# Patient Record
Sex: Male | Born: 1985 | Race: White | Hispanic: No | Marital: Single | Smoking: Current every day smoker
Health system: Southern US, Community
[De-identification: ages and names within clinical notes are randomized; demographics above are authoritative.]

## PROBLEM LIST (undated history)

## (undated) DIAGNOSIS — B958 Unspecified staphylococcus as the cause of diseases classified elsewhere: Secondary | ICD-10-CM

---
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
## 2008-04-26 ENCOUNTER — Emergency Department (HOSPITAL_COMMUNITY): Admission: EM | Admit: 2008-04-26 | Discharge: 2008-04-26 | Payer: Self-pay | Admitting: Emergency Medicine

## 2010-08-06 ENCOUNTER — Emergency Department (HOSPITAL_COMMUNITY): Payer: Self-pay

## 2010-08-06 ENCOUNTER — Emergency Department (HOSPITAL_COMMUNITY)
Admission: EM | Admit: 2010-08-06 | Discharge: 2010-08-06 | Discharge status: Against Medical Advice | Payer: Self-pay | Attending: Emergency Medicine | Admitting: Emergency Medicine

## 2010-08-06 DIAGNOSIS — R220 Localized swelling, mass and lump, head: Secondary | ICD-10-CM | POA: Insufficient documentation

## 2010-08-06 DIAGNOSIS — IMO0002 Reserved for concepts with insufficient information to code with codable children: Secondary | ICD-10-CM | POA: Insufficient documentation

## 2010-08-06 DIAGNOSIS — H5789 Other specified disorders of eye and adnexa: Secondary | ICD-10-CM | POA: Insufficient documentation

## 2010-08-06 DIAGNOSIS — S0280XA Fracture of other specified skull and facial bones, unspecified side, initial encounter for closed fracture: Secondary | ICD-10-CM | POA: Insufficient documentation

## 2010-08-06 DIAGNOSIS — Y929 Unspecified place or not applicable: Secondary | ICD-10-CM | POA: Insufficient documentation

## 2010-08-06 DIAGNOSIS — R221 Localized swelling, mass and lump, neck: Secondary | ICD-10-CM | POA: Insufficient documentation

## 2010-08-06 DIAGNOSIS — S022XXA Fracture of nasal bones, initial encounter for closed fracture: Secondary | ICD-10-CM | POA: Insufficient documentation

## 2010-08-06 DIAGNOSIS — R51 Headache: Secondary | ICD-10-CM | POA: Insufficient documentation

## 2010-08-06 LAB — COMPREHENSIVE METABOLIC PANEL
AST: 82 U/L — ABNORMAL HIGH (ref 0–37)
Albumin: 4.3 g/dL (ref 3.5–5.2)
Calcium: 8.3 mg/dL — ABNORMAL LOW (ref 8.4–10.5)
Creatinine, Ser: 0.87 mg/dL (ref 0.4–1.5)
GFR calc Af Amer: >60 mL/min (ref >60)
Total Protein: 7.3 g/dL (ref 6.0–8.3)

## 2010-08-06 LAB — CBC
HCT: 47 % (ref 39.0–52.0)
MCHC: 37.2 g/dL — ABNORMAL HIGH (ref 30.0–36.0)
MCV: 86.6 fL (ref 78.0–100.0)
RDW: 12.7 % (ref 11.5–15.5)
WBC: 14.8 10*3/uL — ABNORMAL HIGH (ref 4.0–10.5)

## 2010-08-06 LAB — DIFFERENTIAL
Eosinophils Relative: 0 % (ref 0–5)
Lymphocytes Relative: 10 % — ABNORMAL LOW (ref 12–46)
Lymphs Abs: 1.5 10*3/uL (ref 0.7–4.0)
Monocytes Absolute: 1 10*3/uL (ref 0.1–1.0)

## 2010-08-06 LAB — ETHANOL: Alcohol, Ethyl (B): 286 mg/dL — ABNORMAL HIGH (ref 0–10)

## 2010-08-06 MED ORDER — IOHEXOL 300 MG/ML  SOLN: 100.0000 mL | Freq: Once | INTRAMUSCULAR | Status: DC | PRN

## 2010-08-07 ENCOUNTER — Emergency Department (HOSPITAL_COMMUNITY): Payer: No Typology Code available for payment source

## 2010-08-07 ENCOUNTER — Emergency Department (HOSPITAL_COMMUNITY)
Admission: EM | Admit: 2010-08-07 | Discharge: 2010-08-07 | Disposition: A | Discharge status: Home / Self Care | Payer: No Typology Code available for payment source | Attending: Emergency Medicine | Admitting: Emergency Medicine

## 2010-08-07 DIAGNOSIS — R51 Headache: Secondary | ICD-10-CM | POA: Insufficient documentation

## 2010-08-07 DIAGNOSIS — S022XXA Fracture of nasal bones, initial encounter for closed fracture: Secondary | ICD-10-CM | POA: Insufficient documentation

## 2010-08-07 DIAGNOSIS — S0280XA Fracture of other specified skull and facial bones, unspecified side, initial encounter for closed fracture: Secondary | ICD-10-CM | POA: Insufficient documentation

## 2010-08-07 DIAGNOSIS — M542 Cervicalgia: Secondary | ICD-10-CM | POA: Insufficient documentation

## 2010-08-07 DIAGNOSIS — R079 Chest pain, unspecified: Secondary | ICD-10-CM | POA: Insufficient documentation

## 2010-08-07 DIAGNOSIS — H5710 Ocular pain, unspecified eye: Secondary | ICD-10-CM | POA: Insufficient documentation

## 2010-08-07 MED ORDER — IOHEXOL 300 MG/ML  SOLN
80.0000 mL | Freq: Once | INTRAMUSCULAR | Status: AC | PRN
  Administered 2010-08-07: 80 mL via INTRAVENOUS

## 2010-08-12 ENCOUNTER — Inpatient Hospital Stay (INDEPENDENT_AMBULATORY_CARE_PROVIDER_SITE_OTHER)
Admission: RE | Admit: 2010-08-12 | Discharge: 2010-08-12 | Disposition: A | Discharge status: Home / Self Care | Payer: No Typology Code available for payment source | Source: Ambulatory Visit | Attending: Emergency Medicine | Admitting: Emergency Medicine

## 2010-08-12 DIAGNOSIS — S0280XA Fracture of other specified skull and facial bones, unspecified side, initial encounter for closed fracture: Secondary | ICD-10-CM
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
## 2011-10-23 ENCOUNTER — Encounter (HOSPITAL_COMMUNITY): Payer: Self-pay | Admitting: *Deleted

## 2011-10-23 ENCOUNTER — Emergency Department (HOSPITAL_COMMUNITY)
Admission: EM | Admit: 2011-10-23 | Discharge: 2011-10-23 | Disposition: A | Discharge status: Home / Self Care | Payer: Self-pay | Attending: Emergency Medicine | Admitting: Emergency Medicine

## 2011-10-23 DIAGNOSIS — L02419 Cutaneous abscess of limb, unspecified: Secondary | ICD-10-CM | POA: Insufficient documentation

## 2011-10-23 DIAGNOSIS — L0291 Cutaneous abscess, unspecified: Secondary | ICD-10-CM

## 2011-10-23 DIAGNOSIS — F17200 Nicotine dependence, unspecified, uncomplicated: Secondary | ICD-10-CM | POA: Insufficient documentation

## 2011-10-23 DIAGNOSIS — L03319 Cellulitis of trunk, unspecified: Secondary | ICD-10-CM | POA: Insufficient documentation

## 2011-10-23 DIAGNOSIS — L02219 Cutaneous abscess of trunk, unspecified: Secondary | ICD-10-CM | POA: Insufficient documentation

## 2011-10-23 MED ORDER — CLINDAMYCIN PHOSPHATE 600 MG/50ML IV SOLN
600.0000 mg | Freq: Once | INTRAVENOUS | Status: AC
  Administered 2011-10-23: 600 mg via INTRAVENOUS
  Filled 2011-10-23: qty 50

## 2011-10-23 MED ORDER — OXYCODONE-ACETAMINOPHEN 5-325 MG PO TABS
2.0000 | ORAL_TABLET | Freq: Once | ORAL | Status: AC
  Administered 2011-10-23: 2 via ORAL
  Filled 2011-10-23: qty 2

## 2011-10-23 MED ORDER — CEPHALEXIN 500 MG PO CAPS: 500.0000 mg | ORAL_CAPSULE | Freq: Four times a day (QID) | ORAL | Status: DC

## 2011-10-23 MED ORDER — SULFAMETHOXAZOLE-TRIMETHOPRIM 800-160 MG PO TABS: 1.0000 | ORAL_TABLET | Freq: Two times a day (BID) | ORAL | Status: DC

## 2011-10-23 MED ORDER — OXYCODONE-ACETAMINOPHEN 5-325 MG PO TABS: 1.0000 | ORAL_TABLET | Freq: Four times a day (QID) | ORAL | Status: DC | PRN

## 2011-10-23 NOTE — ED Notes (Signed)
Pt states "I have boils from my knees and up, have been having them for 8 yrs, but my mom told me it could be staph and that's serious"

## 2011-10-23 NOTE — ED Provider Notes (Signed)
History     CSN: [id number]  Arrival date & time 10/23/11  1634   First MD Initiated Contact with Patient 10/23/11 1734      Chief Complaint  Patient presents with  . Abscess    (Consider location/radiation/quality/duration/timing/severity/associated sxs/prior treatment) HPI Comments: Patient presenting with an abscess to his abdomen, left anterior thigh, and upper right posterior leg.   He reports that these abscesses have been recurrent over the past 8 years.  He reports that the abscess on his left anterior thigh has been present approximately one week.  Area is gradually becoming more tender and more erythematous.  The abscess on his abdomen has been present for the past 4-5 days.  This area is also becoming more tender and erythematous.  Abscess on his posterior right upper leg has also been present for the past 4-5 days.  No known history of MRSA.  He is not diabetic.  He has not had any fever or chills.  No nausea or vomiting.  He has been putting Hydrogen Peroxide on the areas, which is not helping.    Patient is a 26 y.o. male presenting with abscess. The history is provided by the patient.  Abscess  This is a chronic problem. The problem has been gradually worsening. The abscess is characterized by redness and swelling. Pertinent negatives include no fever and no vomiting. He has received no recent medical care.    History reviewed. No pertinent past medical history.  History reviewed. No pertinent past surgical history.  No family history on file.  History  Substance Use Topics  . Smoking status: Current Everyday Smoker -- 0.5 packs/day  . Smokeless tobacco: Not on file  . Alcohol Use: 8.4 oz/week    14 Cans of beer per week      Review of Systems  Constitutional: Negative for fever and chills.  Respiratory: Negative for shortness of breath.   Gastrointestinal: Negative for nausea and vomiting.  Musculoskeletal: Negative for gait problem.  Skin: Positive for  color change.  All other systems reviewed and are negative.    Allergies  Review of patient's allergies indicates no known allergies.  Home Medications   Current Outpatient Rx  Name Route Sig Dispense Refill  . IBUPROFEN 200 MG PO TABS Oral Take 800 mg by mouth every 8 (eight) hours as needed. For pain.      BP 117/83  Pulse 91  Temp 99.4 F (37.4 C) (Oral)  Resp 18  Wt 250 lb (113.399 kg)  SpO2 100%  Physical Exam  Nursing note and vitals reviewed. Constitutional: He appears well-developed and well-nourished. No distress.  HENT:  Head: Normocephalic and atraumatic.  Mouth/Throat: Oropharynx is clear and moist.  Neck: Normal range of motion. Neck supple.  Cardiovascular: Normal rate, regular rhythm and normal heart sounds.   Pulmonary/Chest: Effort normal and breath sounds normal.  Musculoskeletal: Normal range of motion.       Full ROM of all the joints.  No erythema or edema of joints.  Neurological: He is alert. No sensory deficit. Gait normal.  Skin: He is not diaphoretic.     Psychiatric: He has a normal mood and affect.    ED Course  Procedures (including critical care time)  Labs Reviewed - No data to display No results found.   No diagnosis found.  Patient given round of IV Clindamycin.  Patient requesting to be discharged home.  MDM  Patient presenting with several  different abscesses with surrounding cellulitis.  Patient with  history of the same.  Afebrile.  VSS.  Well appearing.  Patient given a course of IV antibiotics and discharged home with prescription of Bactrim and Keflex.  Patient instructed to follow up in two days to have the areas rechecked.  Return precautions discussed.        Pascal Lux [place], PA-C 10/25/11 1400
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
## 2011-10-27 ENCOUNTER — Emergency Department (HOSPITAL_COMMUNITY)
Admission: EM | Admit: 2011-10-27 | Discharge: 2011-10-27 | Disposition: A | Discharge status: Home / Self Care | Payer: Self-pay | Attending: Emergency Medicine | Admitting: Emergency Medicine

## 2011-10-27 ENCOUNTER — Encounter (HOSPITAL_COMMUNITY): Payer: Self-pay | Admitting: *Deleted

## 2011-10-27 DIAGNOSIS — IMO0002 Reserved for concepts with insufficient information to code with codable children: Secondary | ICD-10-CM

## 2011-10-27 DIAGNOSIS — L03319 Cellulitis of trunk, unspecified: Secondary | ICD-10-CM | POA: Insufficient documentation

## 2011-10-27 DIAGNOSIS — L02219 Cutaneous abscess of trunk, unspecified: Secondary | ICD-10-CM | POA: Insufficient documentation

## 2011-10-27 DIAGNOSIS — F17200 Nicotine dependence, unspecified, uncomplicated: Secondary | ICD-10-CM | POA: Insufficient documentation

## 2011-10-27 MED ORDER — HYDROCODONE-ACETAMINOPHEN 5-325 MG PO TABS: 1.0000 | ORAL_TABLET | Freq: Four times a day (QID) | ORAL | Status: AC | PRN

## 2011-10-27 NOTE — ED Notes (Signed)
Pt has abscesses noted to abd, requesting to have them checked. States "they told me on Monday they were too infected to cut into, so I'm here for a recheck."

## 2011-10-27 NOTE — ED Provider Notes (Signed)
Medical screening examination/treatment/procedure(s) were performed by non-physician practitioner and as supervising physician I was immediately available for consultation/collaboration.  Chrissi Crow L Alpha Mysliwiec, MD 10/27/11 0052 

## 2011-10-27 NOTE — ED Provider Notes (Signed)
History     CSN: [id number]  Arrival date & time 10/27/11  1613   First MD Initiated Contact with Patient 10/27/11 1643      Chief Complaint  Patient presents with  . Wound Check    (Consider location/radiation/quality/duration/timing/severity/associated sxs/prior treatment) HPI Comments: Patient presents emergency department with chief complaint of double and check.  Patient was evaluated on July 15 for multiple abscesses with surrounding cellulitis.  At that time he was treated with IV clindamycin and discharged with Bactrim DS and Keflex.  Patient states that he has been taking antibiotics as directed and that the surrounding erythema has decreased in size.  He also denies any fevers, night sweats, chills, nausea, vomiting, or abdominal pain.  Patient reports that the worst abscess located on the anterior left upper thigh spontaneous bleed began to drain yesterday.  Abscess located on abdomen has become more fluctuant.  Patient has no other complaints at this time.  Patient is a 26 y.o. male presenting with wound check. The history is provided by the patient.  Wound Check     History reviewed. No pertinent past medical history.  History reviewed. No pertinent past surgical history.  History reviewed. No pertinent family history.  History  Substance Use Topics  . Smoking status: Current Everyday Smoker -- 0.5 packs/day  . Smokeless tobacco: Not on file  . Alcohol Use: 8.4 oz/week    14 Cans of beer per week      Review of Systems  Constitutional: Negative for fever, chills, diaphoresis and activity change.       Denies night sweats  HENT: Negative for neck stiffness.   Eyes: Negative for visual disturbance.  Respiratory: Negative for shortness of breath.   Cardiovascular: Negative for chest pain.  Gastrointestinal: Negative for abdominal pain.  Genitourinary: Negative for dysuria, urgency and frequency.  Musculoskeletal: Negative for gait problem.  Skin: Positive for  color change and rash.  Neurological: Negative for dizziness, light-headedness and headaches.  Hematological: Negative for adenopathy.  All other systems reviewed and are negative.    Allergies  Review of patient's allergies indicates no known allergies.  Home Medications   Current Outpatient Rx  Name Route Sig Dispense Refill  . CEPHALEXIN 500 MG PO CAPS Oral Take 500 mg by mouth 4 (four) times daily.    . OXYCODONE-ACETAMINOPHEN 5-325 MG PO TABS Oral Take 1-2 tablets by mouth every 6 (six) hours as needed.    . SULFAMETHOXAZOLE-TRIMETHOPRIM 800-160 MG PO TABS Oral Take 1 tablet by mouth every 12 (twelve) hours.      BP 113/68  Pulse 68  Temp 97.8 F (36.6 C) (Oral)  Resp 14  SpO2 93%  Physical Exam  Nursing note and vitals reviewed. Constitutional: He is oriented to person, place, and time. He appears well-developed and well-nourished. He does not have a sickly appearance. He does not appear ill. No distress.  HENT:  Head: Normocephalic and atraumatic.  Eyes: Conjunctivae and EOM are normal.  Neck: Normal range of motion. Neck supple.  Cardiovascular: Normal rate and regular rhythm.   Pulmonary/Chest: Effort normal and breath sounds normal.  Musculoskeletal: He exhibits no edema.  Lymphadenopathy:       Head (right side): No submental, no preauricular and no posterior auricular adenopathy present.       Head (left side): No submental, no submandibular, no preauricular and no posterior auricular adenopathy present.    He has no axillary adenopathy.  Neurological: He is alert and oriented to person, place, and  time.  Skin: Skin is warm and dry. No rash noted. He is not diaphoretic.          Improved abscesses, see skin chart    ED Course  Procedures (including critical care time)  Labs Reviewed - No data to display No results found.   No diagnosis found. INCISION AND DRAINAGE Performed by: Jaci Carrel Consent: Verbal consent obtained. Risks and benefits:  risks, benefits and alternatives were discussed Type: abscess  Body area: abdomen  Anesthesia: local infiltration  Local anesthetic: lidocaine 2% w epinephrine  Anesthetic total: 2 ml  Complexity: complex Blunt dissection to break up loculations  Drainage: purulent  Drainage amount: large  Packing material: 1/4 in iodoform gauze  Patient tolerance: Patient tolerated the procedure well with no immediate complications.     MDM  Abscesses  Patient with multiple skin abscess, abdomen abscess amenable to incision and drainage.  Abscess was large enough to warrant packing with removal and wound recheck in 2 days. Improved signs of cellulitis.  Will d/c to home.  Advised to continue taking abx as previously prescribed            Jaci Carrel, PA-C 10/27/11 1726

## 2011-10-28 NOTE — ED Provider Notes (Signed)
Medical screening examination/treatment/procedure(s) were performed by non-physician practitioner and as supervising physician I was immediately available for consultation/collaboration.   Jhonny Calixto Y. Jaydin Jalomo, MD 10/28/11 [phone] 

## 2011-10-31 ENCOUNTER — Encounter (HOSPITAL_COMMUNITY): Payer: Self-pay | Admitting: Emergency Medicine

## 2011-10-31 ENCOUNTER — Emergency Department (HOSPITAL_COMMUNITY)
Admission: EM | Admit: 2011-10-31 | Discharge: 2011-10-31 | Disposition: A | Discharge status: Home / Self Care | Payer: No Typology Code available for payment source | Attending: Emergency Medicine | Admitting: Emergency Medicine

## 2011-10-31 DIAGNOSIS — Z5189 Encounter for other specified aftercare: Secondary | ICD-10-CM

## 2011-10-31 DIAGNOSIS — F17200 Nicotine dependence, unspecified, uncomplicated: Secondary | ICD-10-CM | POA: Insufficient documentation

## 2011-10-31 DIAGNOSIS — L02219 Cutaneous abscess of trunk, unspecified: Secondary | ICD-10-CM | POA: Insufficient documentation

## 2011-10-31 NOTE — ED Notes (Signed)
Pt here for wound recheck from I and D of abscess.

## 2011-10-31 NOTE — Discharge Instructions (Signed)
Continue to clean your wound with soap and water.  Apply warm compress over wound at least twice daily until wound is healed.  Return if you notice worsening pain, swelling or if you have other concerns.    Wound Care Wound care helps prevent pain and infection.  You may need a tetanus shot if:  You cannot remember when you had your last tetanus shot.   You have never had a tetanus shot.   The injury broke your skin.  If you need a tetanus shot and you choose not to have one, you may get tetanus. Sickness from tetanus can be serious. HOME CARE   Only take medicine as told by your doctor.   Clean the wound daily with mild soap and water.   Change any bandages (dressings) as told by your doctor.   Put medicated cream and a bandage on the wound as told by your doctor.   Change the bandage if it gets wet, dirty, or starts to smell.   Take showers. Do not take baths, swim, or do anything that puts your wound under water.   Rest and raise (elevate) the wound until the pain and puffiness (swelling) are better.   Keep all doctor visits as told.  GET HELP RIGHT AWAY IF:   Yellowish-white fluid (pus) comes from the wound.   Medicine does not lessen your pain.   There is a red streak going away from the wound.   You cannot move your finger or toe.   You have a fever.  MAKE SURE YOU:   Understand these instructions.   Will watch your condition.   Will get help right away if you are not doing well or get worse.  Document Released: 01/04/2008 Document Revised: 03/16/2011 Document Reviewed: 07/31/2010 [facility] Patient Information 2012 [place], [place].

## 2011-10-31 NOTE — ED Provider Notes (Signed)
History     CSN: [id number]  Arrival date & time 10/31/11  1349   First MD Initiated Contact with Patient 10/31/11 1355      No chief complaint on file.   (Consider location/radiation/quality/duration/timing/severity/associated sxs/prior treatment) HPI  Pt presents for wound recheck.  Has abdominal abscess with I&D procedure 4 days ago.  Is here for wound recheck. Sts wound is healing well, denies any pain, fever, or rash.    No past medical history on file.  No past surgical history on file.  No family history on file.  History  Substance Use Topics  . Smoking status: Current Everyday Smoker -- 0.5 packs/day  . Smokeless tobacco: Not on file  . Alcohol Use: 8.4 oz/week    14 Cans of beer per week      Review of Systems  Constitutional: Negative for fever.  Skin: Positive for wound. Negative for rash.  Neurological: Negative for numbness.  All other systems reviewed and are negative.    Allergies  Review of patient's allergies indicates no known allergies.  Home Medications   Current Outpatient Rx  Name Route Sig Dispense Refill  . CEPHALEXIN 500 MG PO CAPS Oral Take 500 mg by mouth 4 (four) times daily. Pt's on day 8 of therapy    . HYDROCODONE-ACETAMINOPHEN 5-325 MG PO TABS Oral Take 1 tablet by mouth every 6 (six) hours as needed for pain. 15 tablet 0  . SULFAMETHOXAZOLE-TRIMETHOPRIM 800-160 MG PO TABS Oral Take 1 tablet by mouth every 12 (twelve) hours. Pt's on day 8 of therapy    . OXYCODONE-ACETAMINOPHEN 5-325 MG PO TABS Oral Take 1-2 tablets by mouth every 6 (six) hours as needed.      There were no vitals taken for this visit.  Physical Exam  Nursing note and vitals reviewed. Constitutional: He appears well-developed and well-nourished. No distress.  HENT:  Head: Atraumatic.  Eyes: Conjunctivae are normal.  Abdominal: Soft.    Neurological: He is alert.  Skin: Skin is warm. No rash noted.    ED Course  Procedures (including critical care  time)  Labs Reviewed - No data to display No results found.   No diagnosis found.    MDM  abd abscess assessed by me.  Wound appear well healing.  Packing removed with serous sanguinous drainage, no pus.  Care instruction include soap, warm compress were discussed.  Pt voice understanding and agrees with plan.         Fayrene Helper, PA-C 10/31/11 1429

## 2011-11-03 NOTE — ED Provider Notes (Signed)
Medical screening examination/treatment/procedure(s) were performed by non-physician practitioner and as supervising physician I was immediately available for consultation/collaboration.  Donnetta Hutching, MD 11/03/11 [phone]

## 2011-11-27 ENCOUNTER — Encounter (HOSPITAL_COMMUNITY): Payer: Self-pay | Admitting: Emergency Medicine

## 2011-11-27 ENCOUNTER — Emergency Department (HOSPITAL_COMMUNITY)
Admission: EM | Admit: 2011-11-27 | Discharge: 2011-11-27 | Disposition: A | Discharge status: Home / Self Care | Payer: Self-pay | Attending: Emergency Medicine | Admitting: Emergency Medicine

## 2011-11-27 DIAGNOSIS — L0231 Cutaneous abscess of buttock: Secondary | ICD-10-CM | POA: Insufficient documentation

## 2011-11-27 DIAGNOSIS — F17200 Nicotine dependence, unspecified, uncomplicated: Secondary | ICD-10-CM | POA: Insufficient documentation

## 2011-11-27 DIAGNOSIS — L0291 Cutaneous abscess, unspecified: Secondary | ICD-10-CM

## 2011-11-27 HISTORY — DX: Unspecified staphylococcus as the cause of diseases classified elsewhere: B95.8

## 2011-11-27 MED ORDER — OXYCODONE-ACETAMINOPHEN 5-325 MG PO TABS
2.0000 | ORAL_TABLET | Freq: Once | ORAL | Status: AC
  Administered 2011-11-27: 2 via ORAL
  Filled 2011-11-27: qty 2

## 2011-11-27 MED ORDER — CEPHALEXIN 500 MG PO CAPS: 500.0000 mg | ORAL_CAPSULE | Freq: Four times a day (QID) | ORAL | Status: DC

## 2011-11-27 MED ORDER — OXYCODONE-ACETAMINOPHEN 5-325 MG PO TABS: 1.0000 | ORAL_TABLET | Freq: Four times a day (QID) | ORAL | Status: DC | PRN

## 2011-11-27 MED ORDER — SULFAMETHOXAZOLE-TRIMETHOPRIM 800-160 MG PO TABS: 1.0000 | ORAL_TABLET | Freq: Two times a day (BID) | ORAL | Status: DC

## 2011-11-27 NOTE — ED Provider Notes (Signed)
History     CSN: [id number]  Arrival date & time 11/27/11  1251   First MD Initiated Contact with Patient 11/27/11 1348      Chief Complaint  Patient presents with  . Abscess    swollen red area on r/buttock    (Consider location/radiation/quality/duration/timing/severity/associated sxs/prior treatment) HPI Comments: Frank Winters 26 y.o. male   The chief complaint is: Patient presents with:   Abscess - swollen red area on r/buttock   The patient has medical history significant for:   Past Medical History:   Staph infection                                             Patient with history of multiple abscesses present for painful area on lower R buttock. He states that it began 2 days ago and has affected his ability to sleep. Denies fever, chills, or night sweats. Denies NVD, or abdominal pain. Denies SOB.     Patient is a 26 y.o. male presenting with abscess. The history is provided by the patient.  Abscess  Pertinent negatives include no fever, no diarrhea and no vomiting.    Past Medical History  Diagnosis Date  . Staph infection     History reviewed. No pertinent past surgical history.  History reviewed. No pertinent family history.  History  Substance Use Topics  . Smoking status: Current Everyday Smoker -- 0.5 packs/day  . Smokeless tobacco: Not on file  . Alcohol Use: No      Review of Systems  Constitutional: Positive for activity change. Negative for fever, chills and diaphoresis.  Respiratory: Negative for shortness of breath.   Gastrointestinal: Negative for nausea, vomiting, abdominal pain and diarrhea.  All other systems reviewed and are negative.    Allergies  Review of patient's allergies indicates no known allergies.  Home Medications   Current Outpatient Rx  Name Route Sig Dispense Refill  . ACETAMINOPHEN 500 MG PO TABS Oral Take 1,000 mg by mouth every 6 (six) hours as needed. For pain.    . CEPHALEXIN 500 MG PO CAPS Oral  Take 1 capsule (500 mg total) by mouth 4 (four) times daily. 20 capsule 0  . OXYCODONE-ACETAMINOPHEN 5-325 MG PO TABS Oral Take 1 tablet by mouth every 6 (six) hours as needed for pain. 10 tablet 0  . SULFAMETHOXAZOLE-TRIMETHOPRIM 800-160 MG PO TABS Oral Take 1 tablet by mouth 2 (two) times daily. 14 tablet 0    BP 124/72  Pulse 68  Temp 97.8 F (36.6 C) (Oral)  Resp 16  SpO2 99%  Physical Exam  Nursing note and vitals reviewed. Constitutional: He appears well-developed and well-nourished. No distress.  HENT:  Head: Normocephalic and atraumatic.  Mouth/Throat: Oropharynx is clear and moist.  Eyes: Conjunctivae and EOM are normal. Pupils are equal, round, and reactive to light. No scleral icterus.  Neck: Normal range of motion. Neck supple.  Cardiovascular: Normal rate, regular rhythm and normal heart sounds.   Pulmonary/Chest: Effort normal and breath sounds normal.  Abdominal: Soft. There is no tenderness.  Neurological: He is alert.  Skin: Skin is warm and dry.    ED Course  Procedures (including critical care time)  Labs Reviewed - No data to display No results found. INCISION AND DRAINAGE Performed by: Pixie Casino Consent: Verbal consent obtained. Risks and benefits: risks, benefits and alternatives were discussed Type: abscess  Body area: R buttock  Anesthesia: local infiltration  Local anesthetic: lidocaine 1%   Anesthetic total: 2 ml  Complexity: complex Blunt dissection to break up loculations  Drainage: purulent  Drainage amount: 4ml  Packing material: 1/4 in iodoform gauze  Patient tolerance: Patient tolerated the procedure well with no immediate complications.     1. Abscess       MDM  Patient presented with painful, red,indurated area on R inferior buttock. I & D performed without complication. Patient given pain medication in ED. Patient discharged on ABX and pain medication. No red flags for cellulitis. Return precautions given verbally  and in discharge summary.        Pixie Casino, PA-C 11/27/11 1647

## 2011-11-27 NOTE — ED Notes (Signed)
Persistent red raised, painful area on r/buttock. Has hx of same

## 2011-11-30 NOTE — ED Provider Notes (Signed)
Medical screening examination/treatment/procedure(s) were performed by non-physician practitioner and as supervising physician I was immediately available for consultation/collaboration.   Rolan Bucco, MD 11/30/11 [phone]

## 2011-12-04 ENCOUNTER — Emergency Department (HOSPITAL_COMMUNITY)
Admission: EM | Admit: 2011-12-04 | Discharge: 2011-12-04 | Disposition: A | Discharge status: Home / Self Care | Payer: Self-pay | Attending: Emergency Medicine | Admitting: Emergency Medicine

## 2011-12-04 DIAGNOSIS — F17200 Nicotine dependence, unspecified, uncomplicated: Secondary | ICD-10-CM | POA: Insufficient documentation

## 2011-12-04 DIAGNOSIS — L509 Urticaria, unspecified: Secondary | ICD-10-CM | POA: Insufficient documentation

## 2011-12-04 DIAGNOSIS — Z872 Personal history of diseases of the skin and subcutaneous tissue: Secondary | ICD-10-CM

## 2011-12-04 DIAGNOSIS — F419 Anxiety disorder, unspecified: Secondary | ICD-10-CM

## 2011-12-04 DIAGNOSIS — F411 Generalized anxiety disorder: Secondary | ICD-10-CM | POA: Insufficient documentation

## 2011-12-04 MED ORDER — PREDNISONE 20 MG PO TABS: 40.0000 mg | ORAL_TABLET | Freq: Every day | ORAL | Status: AC

## 2011-12-04 MED ORDER — ALPRAZOLAM 1 MG PO TABS: 1.0000 mg | ORAL_TABLET | Freq: Every evening | ORAL | Status: AC | PRN

## 2011-12-04 MED ORDER — RANITIDINE HCL 150 MG PO TABS: 150.0000 mg | ORAL_TABLET | Freq: Two times a day (BID) | ORAL | Status: DC

## 2011-12-05 NOTE — ED Provider Notes (Signed)
History     CSN: [id number]  Arrival date & time 12/04/11  1640   None     Chief Complaint  Patient presents with  . Urticaria    (Consider location/radiation/quality/duration/timing/severity/associated sxs/prior treatment) HPI Comments: Patient presents with a 3 day history of hives outbreak that starts in the morning. He reports the rash being all over his body and pruritic. He states taking 3 benadryl has relieved the rash for the past couple days but he is unsure why the outbreak keeps happening. He reports a childhood history of breaking out in hives with stress and he thinks this might be the same thing. He reports currently being under stress with work, finances, and relationship. He denies any trouble breathing or difficulty swallowing during the hives outbreaks. He denies any allergies or recently changing soaps or detergents. He denies recent illness, fever.   Patient is a 26 y.o. male presenting with urticaria.  Urticaria Associated symptoms include a rash. Pertinent negatives include no abdominal pain, arthralgias, chest pain, chills, diaphoresis, fatigue, fever, headaches, myalgias, nausea, numbness, vomiting or weakness.    Past Medical History  Diagnosis Date  . Staph infection     No past surgical history on file.  No family history on file.  History  Substance Use Topics  . Smoking status: Current Everyday Smoker -- 0.5 packs/day  . Smokeless tobacco: Not on file  . Alcohol Use: No      Review of Systems  Constitutional: Negative for fever, chills, diaphoresis and fatigue.  HENT: Negative for trouble swallowing.   Eyes: Negative for photophobia and visual disturbance.  Respiratory: Negative for choking, chest tightness, shortness of breath and wheezing.   Cardiovascular: Negative for chest pain.  Gastrointestinal: Negative for nausea, vomiting, abdominal pain and diarrhea.  Musculoskeletal: Negative for myalgias and arthralgias.  Skin: Positive for  rash.  Neurological: Negative for dizziness, syncope, weakness, numbness and headaches.    Allergies  Review of patient's allergies indicates no known allergies.  Home Medications   Current Outpatient Rx  Name Route Sig Dispense Refill  . ACETAMINOPHEN 500 MG PO TABS Oral Take 1,000 mg by mouth every 6 (six) hours as needed. For pain.    . CEPHALEXIN 500 MG PO CAPS Oral Take 500 mg by mouth 4 (four) times daily.    Marland Kitchen DIPHENHYDRAMINE HCL 25 MG PO TABS Oral Take 50 mg by mouth every 6 (six) hours as needed. Hives    . OXYCODONE-ACETAMINOPHEN 5-325 MG PO TABS Oral Take 1 tablet by mouth every 4 (four) hours as needed. Pain    . SULFAMETHOXAZOLE-TRIMETHOPRIM 800-160 MG PO TABS Oral Take 1 tablet by mouth 2 (two) times daily.    Marland Kitchen ALPRAZOLAM 1 MG PO TABS Oral Take 1 tablet (1 mg total) by mouth at bedtime as needed for sleep. 10 tablet 0  . PREDNISONE 20 MG PO TABS Oral Take 2 tablets (40 mg total) by mouth daily. 10 tablet 0  . RANITIDINE HCL 150 MG PO TABS Oral Take 1 tablet (150 mg total) by mouth 2 (two) times daily. 60 tablet 0    BP 148/97  Pulse 99  Temp 98.6 F (37 C)  Resp 16  SpO2 99%  Physical Exam  Nursing note and vitals reviewed. Constitutional: He is oriented to person, place, and time. He appears well-developed and well-nourished. No distress.  HENT:  Head: Normocephalic and atraumatic.  Mouth/Throat: No oropharyngeal exudate.       No evidence of urticarial lesions in mouth.  Eyes: Conjunctivae are normal. Pupils are equal, round, and reactive to light. No scleral icterus.  Neck: Normal range of motion. Neck supple.  Cardiovascular: Normal rate and regular rhythm.  Exam reveals no gallop and no friction rub.   No murmur heard. Pulmonary/Chest: Effort normal and breath sounds normal. No respiratory distress. He has no wheezes. He has no rales. He exhibits no tenderness.  Abdominal: Soft. There is no tenderness.  Musculoskeletal: Normal range of motion.    Neurological: He is alert and oriented to person, place, and time.  Skin: Skin is warm and dry. He is not diaphoretic.       Few remaining non-raised lesions on legs and arms bilaterally.   Psychiatric: His behavior is normal.       Patient has flat affect and reports feeling anxious.     ED Course  Procedures (including critical care time)  Labs Reviewed - No data to display No results found.   1. History of urticaria   2. Anxiety       MDM   Patient shows no current signs of rash or hives. He reports a childhood history of hives with anxiety and he reports being under a lot of stress currently with his job, relationship, and finances. The rash may have an emotional aspect to the outbreak due to his childhood history and current anxiety and unknown trigger. I will prescribe him prednisone and zantac for outbreaks as well as a few xanax for anxiety. He should follow up with a low-cost medical provider in the area for outpatient management of his anxiety and rash. Patient has no signs of respiratory compromise. No further evaluation needed at this time.         Emilia Beck, PA-C 12/06/11 [phone]

## 2011-12-08 NOTE — ED Provider Notes (Signed)
Medical screening examination/treatment/procedure(s) were performed by non-physician practitioner and as supervising physician I was immediately available for consultation/collaboration.  Akeylah Hendel T Yudith Norlander, MD 12/08/11 2350 

## 2012-08-18 ENCOUNTER — Emergency Department (HOSPITAL_COMMUNITY)
Admission: EM | Admit: 2012-08-18 | Discharge: 2012-08-19 | Disposition: A | Discharge status: Home / Self Care | Payer: Self-pay | Attending: Emergency Medicine | Admitting: Emergency Medicine

## 2012-08-18 ENCOUNTER — Encounter (HOSPITAL_COMMUNITY): Payer: Self-pay | Admitting: *Deleted

## 2012-08-18 DIAGNOSIS — F17200 Nicotine dependence, unspecified, uncomplicated: Secondary | ICD-10-CM | POA: Insufficient documentation

## 2012-08-18 DIAGNOSIS — L0231 Cutaneous abscess of buttock: Secondary | ICD-10-CM | POA: Insufficient documentation

## 2012-08-18 DIAGNOSIS — Z8619 Personal history of other infectious and parasitic diseases: Secondary | ICD-10-CM | POA: Insufficient documentation

## 2012-08-18 DIAGNOSIS — L0291 Cutaneous abscess, unspecified: Secondary | ICD-10-CM

## 2012-08-18 MED ORDER — SULFAMETHOXAZOLE-TRIMETHOPRIM 800-160 MG PO TABS: 1.0000 | ORAL_TABLET | Freq: Two times a day (BID) | ORAL | Status: DC

## 2012-08-18 MED ORDER — HYDROCODONE-ACETAMINOPHEN 5-325 MG PO TABS
2.0000 | ORAL_TABLET | Freq: Once | ORAL | Status: AC
  Administered 2012-08-18: 2 via ORAL
  Filled 2012-08-18: qty 2

## 2012-08-18 NOTE — ED Notes (Signed)
Pt presents w/ c/o multiple abscesses w/ hx of same. Pt has recurring abscess to R buttock.

## 2012-08-18 NOTE — ED Provider Notes (Signed)
History     CSN: [id number]  Arrival date & time 08/18/12  2156   First MD Initiated Contact with Patient 08/18/12 2222      Chief Complaint  Patient presents with  . Abscess    (Consider location/radiation/quality/duration/timing/severity/associated sxs/prior treatment) HPI Comments: 27 y/o male with a PMHx of multiple abscesses presents to the ED complaining of an abscess to his right buttock region that he noticed 5 days ago. Patient states area has been getting larger and more red over the past 5 days. No drainage. Also states there was an abscess on the right side of his suprapubic region presenting around the same time which drained itself. Denies fever, chills, nausea, vomiting.  Patient is a 27 y.o. male presenting with abscess. The history is provided by the patient.  Abscess Associated symptoms: no fever, no nausea and no vomiting     Past Medical History  Diagnosis Date  . Staph infection     History reviewed. No pertinent past surgical history.  History reviewed. No pertinent family history.  History  Substance Use Topics  . Smoking status: Current Every Day Smoker -- 0.50 packs/day    Types: Cigarettes  . Smokeless tobacco: Not on file  . Alcohol Use: 0.0 oz/week     Comment: 1-2 beers/ day      Review of Systems  Constitutional: Negative for fever and chills.  Gastrointestinal: Negative for nausea and vomiting.  Skin:       Positive for multiple abscesses.  All other systems reviewed and are negative.    Allergies  Review of patient's allergies indicates no known allergies.  Home Medications   Current Outpatient Rx  Name  Route  Sig  Dispense  Refill  . acetaminophen (TYLENOL) 500 MG tablet   Oral   Take 1,000 mg by mouth every 6 (six) hours as needed. For pain.         Marland Kitchen DM-Phenylephrine-Acetaminophen (VICKS DAYQUIL COLD & FLU) 10-5-325 MG CAPS   Oral   Take 1 capsule by mouth.         . loratadine (CLARITIN) 10 MG tablet   Oral  Take 10 mg by mouth daily.           BP 142/84  Pulse 94  Temp(Src) 97.4 F (36.3 C) (Oral)  Resp 16  Ht 6\' 11"  (2.108 m)  Wt 250 lb (113.399 kg)  BMI 25.52 kg/m2  SpO2 98%  Physical Exam  Nursing note and vitals reviewed. Constitutional: He is oriented to person, place, and time. He appears well-developed and well-nourished. No distress.  HENT:  Head: Normocephalic and atraumatic.  Mouth/Throat: Oropharynx is clear and moist.  Eyes: Conjunctivae are normal.  Neck: Normal range of motion. Neck supple.  Cardiovascular: Normal rate, regular rhythm and normal heart sounds.   Pulmonary/Chest: Effort normal and breath sounds normal.  Musculoskeletal: Normal range of motion. He exhibits no edema.  Lymphadenopathy:       Right: No inguinal adenopathy present.       Left: No inguinal adenopathy present.  Neurological: He is alert and oriented to person, place, and time.  Skin: Skin is warm and dry. He is not diaphoretic.     1 cm diameter area of erythema in right groin region. No abscess. 1 mm central opening. No active drainage. Tender to palpation.  Psychiatric: He has a normal mood and affect. His behavior is normal.    ED Course  Procedures (including critical care time) INCISION AND DRAINAGE Performed by:  Johnnette Gourd, Leo Rod PA student Consent: Verbal consent obtained. Risks and benefits: risks, benefits and alternatives were discussed Type: abscess  Body area: right buttock  Anesthesia: local infiltration  Incision was made with a scalpel.  Local anesthetic: lidocaine 2% with epinephrine  Anesthetic total: 5 ml  Complexity: complex Blunt dissection to break up loculations  Drainage: purulent  Drainage amount: small  Packing material: none  Patient tolerance: Patient tolerated the procedure well with no immediate complications.    Labs Reviewed - No data to display No results found.   1. Abscess and cellulitis       MDM  27 y/o  male with abscess and cellulitis. Abscess drained without any complication. Rx bactrim due to surrounding cellulitis. Return precautions discussed. Patient states understanding of plan and is agreeable.        Trevor Mace, PA-C 08/18/12 2328

## 2012-08-18 NOTE — ED Provider Notes (Signed)
Medical screening examination/treatment/procedure(s) were performed by non-physician practitioner and as supervising physician I was immediately available for consultation/collaboration.  Gilda Crease, MD 08/18/12 [phone]

## 2013-09-26 ENCOUNTER — Emergency Department (HOSPITAL_COMMUNITY)
Admission: EM | Admit: 2013-09-26 | Discharge: 2013-09-26 | Disposition: A | Discharge status: Home / Self Care | Payer: No Typology Code available for payment source | Attending: Emergency Medicine | Admitting: Emergency Medicine

## 2013-09-26 ENCOUNTER — Encounter (HOSPITAL_COMMUNITY): Payer: Self-pay | Admitting: Emergency Medicine

## 2013-09-26 DIAGNOSIS — Z792 Long term (current) use of antibiotics: Secondary | ICD-10-CM | POA: Insufficient documentation

## 2013-09-26 DIAGNOSIS — L0291 Cutaneous abscess, unspecified: Secondary | ICD-10-CM

## 2013-09-26 DIAGNOSIS — L0231 Cutaneous abscess of buttock: Secondary | ICD-10-CM | POA: Insufficient documentation

## 2013-09-26 DIAGNOSIS — Z79899 Other long term (current) drug therapy: Secondary | ICD-10-CM | POA: Insufficient documentation

## 2013-09-26 DIAGNOSIS — L03317 Cellulitis of buttock: Principal | ICD-10-CM

## 2013-09-26 DIAGNOSIS — F17200 Nicotine dependence, unspecified, uncomplicated: Secondary | ICD-10-CM | POA: Insufficient documentation

## 2013-09-26 DIAGNOSIS — Z8619 Personal history of other infectious and parasitic diseases: Secondary | ICD-10-CM | POA: Insufficient documentation

## 2013-09-26 MED ORDER — OXYCODONE-ACETAMINOPHEN 5-325 MG PO TABS
2.0000 | ORAL_TABLET | Freq: Once | ORAL | Status: AC
  Administered 2013-09-26: 2 via ORAL
  Filled 2013-09-26: qty 2

## 2013-09-26 MED ORDER — SULFAMETHOXAZOLE-TRIMETHOPRIM 800-160 MG PO TABS: 1.0000 | ORAL_TABLET | Freq: Two times a day (BID) | ORAL | Status: DC

## 2013-09-26 MED ORDER — HYDROCODONE-ACETAMINOPHEN 5-325 MG PO TABS: 1.0000 | ORAL_TABLET | Freq: Four times a day (QID) | ORAL | Status: DC | PRN

## 2013-09-26 MED ORDER — CEPHALEXIN 500 MG PO CAPS: 500.0000 mg | ORAL_CAPSULE | Freq: Four times a day (QID) | ORAL | Status: DC

## 2013-09-26 NOTE — Discharge Instructions (Signed)
1. Medications: keflex, bactrim, vicodin, usual home medications 2. Treatment: rest, drink plenty of fluids, warm water soaks and flushing 3. Follow Up: Please followup with your primary doctor, Urgent care or the ED for wound check in 2 days.     Abscess An abscess is an infected area that contains a collection of pus and debris.It can occur in almost any part of the body. An abscess is also known as a furuncle or boil. CAUSES  An abscess occurs when tissue gets infected. This can occur from blockage of oil or sweat glands, infection of hair follicles, or a minor injury to the skin. As the body tries to fight the infection, pus collects in the area and creates pressure under the skin. This pressure causes pain. People with weakened immune systems have difficulty fighting infections and get certain abscesses more often.  SYMPTOMS Usually an abscess develops on the skin and becomes a painful mass that is red, warm, and tender. If the abscess forms under the skin, you may feel a moveable soft area under the skin. Some abscesses break open (rupture) on their own, but most will continue to get worse without care. The infection can spread deeper into the body and eventually into the bloodstream, causing you to feel ill.  DIAGNOSIS  Your caregiver will take your medical history and perform a physical exam. A sample of fluid may also be taken from the abscess to determine what is causing your infection. TREATMENT  Your caregiver may prescribe antibiotic medicines to fight the infection. However, taking antibiotics alone usually does not cure an abscess. Your caregiver may need to make a small cut (incision) in the abscess to drain the pus. In some cases, gauze is packed into the abscess to reduce pain and to continue draining the area. HOME CARE INSTRUCTIONS   Only take over-the-counter or prescription medicines for pain, discomfort, or fever as directed by your caregiver.  If you were prescribed  antibiotics, take them as directed. Finish them even if you start to feel better.  If gauze is used, follow your caregiver's directions for changing the gauze.  To avoid spreading the infection:  Keep your draining abscess covered with a bandage.  Wash your hands well.  Do not share personal care items, towels, or whirlpools with others.  Avoid skin contact with others.  Keep your skin and clothes clean around the abscess.  Keep all follow-up appointments as directed by your caregiver. SEEK MEDICAL CARE IF:   You have increased pain, swelling, redness, fluid drainage, or bleeding.  You have muscle aches, chills, or a general ill feeling.  You have a fever. MAKE SURE YOU:   Understand these instructions.  Will watch your condition.  Will get help right away if you are not doing well or get worse. Document Released: 01/04/2005 Document Revised: 09/26/2011 Document Reviewed: 06/09/2011 [facility] Patient Information 2015 [place], [place]. This information is not intended to replace advice given to you by your health care provider. Make sure you discuss any questions you have with your health care provider.

## 2013-09-26 NOTE — ED Notes (Signed)
Pt c/o abscess to L buttock for the past 2 days. Pt has had these many times before. Pt thinks this one may be infected.

## 2013-09-26 NOTE — ED Notes (Signed)
Pt has a ride home.  

## 2013-09-26 NOTE — ED Provider Notes (Signed)
History/physical exam/procedure(s) were performed by non-physician practitioner and as supervising physician I was immediately available for consultation/collaboration. I have reviewed all notes and am in agreement with care and plan.   Danielle S Ray, MD 09/26/13 2357 

## 2013-09-26 NOTE — ED Provider Notes (Signed)
CSN: [id number]     Arrival date & time 09/26/13  1941 History  This chart was scribed for Dierdre ForthHannah Muthersbaugh, PA-C, non-physician practitioner working with Frank Quarryanielle S Ray, MD by Nicholos Johnsenise Iheanachor, ED scribe. This patient was seen in room WTR6/WTR6 and the patient's care was started at 8:16 PM.    Chief Complaint  Patient presents with  . Abscess    The history is provided by the patient. No language interpreter was used.   HPI Comments: Frank Winters is a 28 y.o. male w/ hx of abscess presents to the Emergency Department for an abscess on the left buttock; onset 4 days ago.  Reports abscess in the same location approximately 1.5 months ago that resolved on its own. Reports frequent abscesses that appear along the lower extremities that also resolve on their own. No hx of diabetes. No known hx of immunosuppression. Denies fever, chills, nausea, or vomiting.  No treatment PTA.  NO alleviating factors, pt reports his work pants aggravate the area.     Past Medical History  Diagnosis Date  . Staph infection    History reviewed. No pertinent past surgical history. No family history on file. History  Substance Use Topics  . Smoking status: Current Every Day Smoker -- 0.50 packs/day    Types: Cigarettes  . Smokeless tobacco: Not on file  . Alcohol Use: 0.0 oz/week     Comment: 1-2 beers/ day    Review of Systems  Constitutional: Negative for fever and chills.  Gastrointestinal: Negative for nausea and vomiting.  Skin: Positive for rash.  Allergic/Immunologic: Negative for immunocompromised state.  Hematological: Does not bruise/bleed easily.  Psychiatric/Behavioral: The patient is not nervous/anxious.   All other systems reviewed and are negative.  Allergies  Review of patient's allergies indicates no known allergies.  Home Medications   Prior to Admission medications   Medication Sig Start Date End Date Taking? Authorizing Dabria Wadas  acetaminophen (TYLENOL) 500 MG tablet Take  1,000 mg by mouth every 6 (six) hours as needed. For pain.    Historical Shakela Donati, MD  cephALEXin (KEFLEX) 500 MG capsule Take 1 capsule (500 mg total) by mouth 4 (four) times daily. 09/26/13   Hannah Muthersbaugh, PA-C  DM-Phenylephrine-Acetaminophen (VICKS DAYQUIL COLD & FLU) 10-5-325 MG CAPS Take 1 capsule by mouth.    Historical Allora Bains, MD  HYDROcodone-acetaminophen (NORCO/VICODIN) 5-325 MG per tablet Take 1-2 tablets by mouth every 6 (six) hours as needed for moderate pain or severe pain. 09/26/13   Hannah Muthersbaugh, PA-C  loratadine (CLARITIN) 10 MG tablet Take 10 mg by mouth daily.    Historical Emmory Solivan, MD  sulfamethoxazole-trimethoprim (BACTRIM DS,SEPTRA DS) 800-160 MG per tablet Take 1 tablet by mouth 2 (two) times daily. One po bid x 7 days 08/18/12   Trevor Maceobyn M Albert, PA-C  sulfamethoxazole-trimethoprim [facility](SEPTRA DS) 800-160 MG per tablet Take 1 tablet by mouth every 12 (twelve) hours. 09/26/13   Hannah Muthersbaugh, PA-C   Triage vitals: BP 121/91  Pulse 77  Temp(Src) 98.7 F (37.1 C) (Oral)  Resp 16  SpO2 100%  Physical Exam  Nursing note and vitals reviewed. Constitutional: He is oriented to person, place, and time. He appears well-developed and well-nourished. No distress.  HENT:  Head: Normocephalic and atraumatic.  Eyes: Conjunctivae are normal. No scleral icterus.  Neck: Normal range of motion.  Cardiovascular: Normal rate, regular rhythm, normal heart sounds and intact distal pulses.   No murmur heard. Pulmonary/Chest: Effort normal and breath sounds normal. No respiratory distress.  Abdominal: Soft.  He exhibits no distension. There is no tenderness.  Lymphadenopathy:    He has no cervical adenopathy.  Neurological: He is alert and oriented to person, place, and time.  Skin: Skin is warm and dry. He is not diaphoretic. There is erythema.  4x4 cm area of erythema and induration to the left buttock that does not extend into the gluteal cleft with a small amount of  surrounding induration.   Psychiatric: He has a normal mood and affect.    ED Course  Procedures (including critical care time) DIAGNOSTIC STUDIES: Oxygen Saturation is 100% on room air, normal by my interpretation.    COORDINATION OF CARE: At 8:20 PM: Discussed treatment plan with patient which includes incision and drainage of abscess on left buttock. Patient agrees. Will also receive prescription for Bactrim.   INCISION AND DRAINAGE PROCEDURE NOTE: Patient identification was confirmed and verbal consent was obtained. This procedure was performed by Dierdre ForthHannah Muthersbaugh, PA-C at 8:38 PM. Site: left buttock Sterile procedures observed Needle size: 25 G Anesthetic used (type and amt): 5 cc 1% lidocaine w/ epinephrine Blade size: 11 Drainage: copious  Complexity: Complex Packing used: none Site anesthetized, incision made over site, wound drained and explored loculations, rinsed with copious amounts of normal saline, wound packed with sterile gauze, covered with dry, sterile dressing.  Pt tolerated procedure well without complications.  Instructions for care discussed verbally and pt provided with additional written instructions for homecare and f/u.     Labs Review Labs Reviewed - No data to display  Imaging Review No results found.   EKG Interpretation None      MDM   Final diagnoses:  Abscess   Frank Winters presents with recurrent abscess to the left buttock with similar event 1.5 mos ago in the same spot.  Patient with skin abscess amenable to incision and drainage.  Abscess was not large enough to warrant packing or drain,  wound recheck in 2 days. Encouraged home warm soaks and flushing.  Mild signs of cellulitis is surrounding skin.  Will d/c to home with bactrim and keflex since this is recurrent.    I have personally reviewed patient's vitals, nursing note and any pertinent labs or imaging.  I performed an undressed physical exam.    At this time, it has  been determined that no acute conditions requiring further emergency intervention. The patient/guardian have been advised of the diagnosis and plan. I reviewed all labs and imaging including any potential incidental findings. We have discussed signs and symptoms that warrant return to the ED, such as fever, chills, N/V.  Patient/guardian has voiced understanding and agreed to follow-up with the PCP or specialist in  2 days.  Vital signs are stable at discharge.   BP 121/91  Pulse 77  Temp(Src) 98.7 F (37.1 C) (Oral)  Resp 16  SpO2 100%      .   I personally performed the services described in this documentation, which was scribed in my presence. The recorded information has been reviewed and is accurate.     Dahlia ClientHannah Muthersbaugh, PA-C 09/26/13 2106

## 2014-04-05 ENCOUNTER — Emergency Department (HOSPITAL_COMMUNITY)
Admission: EM | Admit: 2014-04-05 | Discharge: 2014-04-05 | Disposition: A | Discharge status: Home / Self Care | Payer: Self-pay | Attending: Emergency Medicine | Admitting: Emergency Medicine

## 2014-04-05 ENCOUNTER — Emergency Department (HOSPITAL_COMMUNITY): Payer: Self-pay

## 2014-04-05 ENCOUNTER — Encounter (HOSPITAL_COMMUNITY): Payer: Self-pay | Admitting: Nurse Practitioner

## 2014-04-05 DIAGNOSIS — S51832A Puncture wound without foreign body of left forearm, initial encounter: Secondary | ICD-10-CM | POA: Insufficient documentation

## 2014-04-05 DIAGNOSIS — T148XXA Other injury of unspecified body region, initial encounter: Secondary | ICD-10-CM

## 2014-04-05 DIAGNOSIS — T2220XA Burn of second degree of shoulder and upper limb, except wrist and hand, unspecified site, initial encounter: Secondary | ICD-10-CM

## 2014-04-05 DIAGNOSIS — Z23 Encounter for immunization: Secondary | ICD-10-CM | POA: Insufficient documentation

## 2014-04-05 DIAGNOSIS — Y92000 Kitchen of unspecified non-institutional (private) residence as  the place of occurrence of the external cause: Secondary | ICD-10-CM | POA: Insufficient documentation

## 2014-04-05 DIAGNOSIS — T22212A Burn of second degree of left forearm, initial encounter: Secondary | ICD-10-CM | POA: Insufficient documentation

## 2014-04-05 DIAGNOSIS — Y278XXA Contact with other hot objects, undetermined intent, initial encounter: Secondary | ICD-10-CM | POA: Insufficient documentation

## 2014-04-05 DIAGNOSIS — Z720 Tobacco use: Secondary | ICD-10-CM | POA: Insufficient documentation

## 2014-04-05 DIAGNOSIS — R200 Anesthesia of skin: Secondary | ICD-10-CM | POA: Insufficient documentation

## 2014-04-05 DIAGNOSIS — Y998 Other external cause status: Secondary | ICD-10-CM | POA: Insufficient documentation

## 2014-04-05 DIAGNOSIS — Z79899 Other long term (current) drug therapy: Secondary | ICD-10-CM | POA: Insufficient documentation

## 2014-04-05 DIAGNOSIS — Y9389 Activity, other specified: Secondary | ICD-10-CM | POA: Insufficient documentation

## 2014-04-05 MED ORDER — HYDROCODONE-ACETAMINOPHEN 5-325 MG PO TABS
2.0000 | ORAL_TABLET | Freq: Once | ORAL | Status: AC
  Administered 2014-04-05: 2 via ORAL
  Filled 2014-04-05: qty 2

## 2014-04-05 MED ORDER — TETANUS-DIPHTH-ACELL PERTUSSIS 5-2.5-18.5 LF-MCG/0.5 IM SUSP
0.5000 mL | Freq: Once | INTRAMUSCULAR | Status: AC
  Administered 2014-04-05: 0.5 mL via INTRAMUSCULAR
  Filled 2014-04-05: qty 0.5

## 2014-04-05 MED ORDER — SILVER SULFADIAZINE 1 % EX CREA
TOPICAL_CREAM | Freq: Once | CUTANEOUS | Status: AC
  Administered 2014-04-05: 20:00:00 via TOPICAL
  Filled 2014-04-05: qty 85

## 2014-04-05 MED ORDER — CEPHALEXIN 500 MG PO CAPS: 500.0000 mg | ORAL_CAPSULE | Freq: Four times a day (QID) | ORAL | Status: DC

## 2014-04-05 MED ORDER — HYDROCODONE-ACETAMINOPHEN 5-325 MG PO TABS: 1.0000 | ORAL_TABLET | Freq: Four times a day (QID) | ORAL | Status: DC | PRN

## 2014-04-05 NOTE — ED Notes (Signed)
Patient transported to X-ray 

## 2014-04-05 NOTE — ED Notes (Signed)
PA at bedside.
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
## 2014-08-16 ENCOUNTER — Emergency Department (HOSPITAL_COMMUNITY)
Admission: EM | Admit: 2014-08-16 | Discharge: 2014-08-16 | Disposition: A | Discharge status: Home / Self Care | Payer: Self-pay | Attending: Emergency Medicine | Admitting: Emergency Medicine

## 2014-08-16 ENCOUNTER — Encounter (HOSPITAL_COMMUNITY): Payer: Self-pay | Admitting: Emergency Medicine

## 2014-08-16 DIAGNOSIS — Z792 Long term (current) use of antibiotics: Secondary | ICD-10-CM | POA: Insufficient documentation

## 2014-08-16 DIAGNOSIS — L0291 Cutaneous abscess, unspecified: Secondary | ICD-10-CM

## 2014-08-16 DIAGNOSIS — L0390 Cellulitis, unspecified: Secondary | ICD-10-CM

## 2014-08-16 DIAGNOSIS — L03115 Cellulitis of right lower limb: Secondary | ICD-10-CM | POA: Insufficient documentation

## 2014-08-16 DIAGNOSIS — Z720 Tobacco use: Secondary | ICD-10-CM | POA: Insufficient documentation

## 2014-08-16 DIAGNOSIS — R110 Nausea: Secondary | ICD-10-CM | POA: Insufficient documentation

## 2014-08-16 DIAGNOSIS — Z8619 Personal history of other infectious and parasitic diseases: Secondary | ICD-10-CM | POA: Insufficient documentation

## 2014-08-16 DIAGNOSIS — L02415 Cutaneous abscess of right lower limb: Secondary | ICD-10-CM | POA: Insufficient documentation

## 2014-08-16 MED ORDER — SULFAMETHOXAZOLE-TRIMETHOPRIM 800-160 MG PO TABS: 1.0000 | ORAL_TABLET | Freq: Two times a day (BID) | ORAL | Status: AC

## 2014-08-16 MED ORDER — HYDROCODONE-ACETAMINOPHEN 5-325 MG PO TABS
1.0000 | ORAL_TABLET | Freq: Once | ORAL | Status: AC
  Administered 2014-08-16: 1 via ORAL
  Filled 2014-08-16: qty 1

## 2014-08-16 MED ORDER — CEPHALEXIN 500 MG PO CAPS: 500.0000 mg | ORAL_CAPSULE | Freq: Four times a day (QID) | ORAL | Status: AC

## 2014-08-16 MED ORDER — LIDOCAINE HCL (PF) 1 % IJ SOLN
5.0000 mL | Freq: Once | INTRAMUSCULAR | Status: AC
  Administered 2014-08-16: 5 mL via INTRADERMAL
  Filled 2014-08-16: qty 5

## 2014-08-16 MED ORDER — HYDROCODONE-ACETAMINOPHEN 5-325 MG PO TABS: 1.0000 | ORAL_TABLET | ORAL | Status: AC | PRN

## 2014-08-16 NOTE — ED Notes (Signed)
Using clean, technique, cleansed area with normal saline. Applied 3 4x4 gauze over the site.Secured with surgical tape. Pt tolerated it well.

## 2014-08-16 NOTE — Discharge Instructions (Signed)
Please follow directions provided. Be sure to follow-up here in the emergency department in 2 days for a wound recheck. Use warm soapy water soaks at home to encourage the wound continued to drain. Replace your dressing daily keep it clean and dry. Take both antibiotics as directed until they are all gone. Don't hesitate to return for any new, worsening, or concerning symptoms.   SEEK IMMEDIATE MEDICAL CARE IF:  You develop increased pain, swelling, redness, drainage, or bleeding in the wound site.  You develop signs of generalized infection including muscle aches, chills, fever, or a general ill feeling.  An oral temperature above 102 F (38.9 C) develops, not controlled by medication.    Emergency Department Resource Guide 1) Find a Doctor and Pay Out of Pocket Although you won't have to find out who is covered by your insurance plan, it is a good idea to ask around and get recommendations. You will then need to call the office and see if the doctor you have chosen will accept you as a new patient and what types of options they offer for patients who are self-pay. Some doctors offer discounts or will set up payment plans for their patients who do not have insurance, but you will need to ask so you aren't surprised when you get to your appointment.  2) Contact Your Local Health Department Not all health departments have doctors that can see patients for sick visits, but many do, so it is worth a call to see if yours does. If you don't know where your local health department is, you can check in your phone book. The CDC also has a tool to help you locate your state's health department, and many state websites also have listings of all of their local health departments.  3) Find a Walk-in Clinic If your illness is not likely to be very severe or complicated, you may want to try a walk in clinic. These are popping up all over the country in pharmacies, drugstores, and shopping centers. They're  usually staffed by nurse practitioners or physician assistants that have been trained to treat common illnesses and complaints. They're usually fairly quick and inexpensive. However, if you have serious medical issues or chronic medical problems, these are probably not your best option.  No Primary Care Doctor: - Call Health Connect at  [phone] - they can help you locate a primary care doctor that  accepts your insurance, provides certain services, etc. - Physician Referral Service- [phone]  Chronic Pain Problems: Organization         Address  Phone   Notes  [facility]  [phone] Patients need to be referred by their primary care doctor.   Medication Assistance: Organization         Address  Phone   Notes  [facility] Medication [facility] [address] [place] [place], [place] [address] [phone] --Must be a resident of [facility] -- Must have NO insurance coverage whatsoever (no Medicaid/ Medicare, etc.) -- The pt. MUST have a primary care doctor that directs their care regularly and follows them in the community   [facility]  [phone]   [employer]  [phone]    Agencies that provide inexpensive medical care: Organization         Address  Phone   Notes  [facility]  [phone]   [facility]    [phone]   [facility]  [address] [place], [place] [address] [phone]   [facility] [address] [place] [place] [phone]   Planned Parenthood    [phone]   [facility]    [phone]   [facility]  [address], [place] Phone:  [phone], Fax:  [phone] Hours of Operation:  9 am - 6 pm, M-F.  Also accepts Medicaid/Medicare and self-pay.  [facility]  [address], [place] Phone: [phone], Fax: [phone]. Hours  of Operation:  8:30 am - 5:30 pm, M-F.  Also accepts Medicaid and self-pay.  [facility] [place] [address], [place] Phone: [phone]   [facility] [address] Natasha Bence [place], [place] [phone], Ext. [phone] Mondays & Thursdays: 7-9 AM.  First 15 patients are seen on a first come, first serve basis.    Medicaid-accepting [facility] Providers:  Organization         Address  Phone   Notes  [facility] [address], [place] [phone] Also accepts self-pay patients.  [facility] [address] Laurell Josephs [place], [place]  [phone]   [facility] [address], [place] [phone]   [facility] [address] [place] [phone]   Renaye Rakers [address], [place]   [phone] Only accepts [place] patients after they have their name applied to their card.   Self-Pay (no insurance) in [facility]:  Organization         Address  Phone   Notes  Sickle Cell Patients, [facility] Medicine [address] [place], [place] [phone]   [facility] [address] [place], [place] [phone]   [facility] [place]  [address], [place] [phone]   [facility]/Dr. Osei-Bonsu  [address] [place] or [address], [place] [phone] Phone number for both [place] and [place] locations is the same.  [facility] and [facility] [address] [place] [phone]   [facility] [address], [place] or [address] [phone]   [facility] [address], [place] [phone], phone; [phone], fax Sees patients 1st and 3rd Saturday of every month.  Must not qualify for public or private insurance (i.e. Medicaid,  Medicare, [place] Health Choice, Veterans' Benefits)  Household income should be no more than 200% of the poverty level The clinic cannot treat you if you are pregnant or think you are pregnant  Sexually transmitted diseases are not treated at the clinic.    Dental Care: Organization         Address  Phone  Notes  [facility] Department of [facility] [address] [place], [place] [phone] Accepts children up to age 28 who are enrolled in [place] or [place] Health Choice; pregnant women with a Medicaid card; and children who have applied for Medicaid or [place] Health Choice, but were declined, whose parents can pay a reduced fee at time of service.  [facility] Department of [facility]  [address], [place] [phone] Accepts children up to age 52 who are enrolled in [place]  or [place] Health Choice; pregnant women with a Medicaid card; and children who have applied for Medicaid or [place] Health Choice, but were declined, whose parents can pay a reduced fee at time of service.  [facility]  [address], [place] [phone] Patients are seen by appointment only. Walk-ins are not accepted. [facility] will see patients 28 years of age and older. Monday - Tuesday (8am-5pm) Most Wednesdays (8:30-5pm) $30 per visit, cash only  [facility]  [address], [facility] [place] [phone] Patients are seen by appointment only. Walk-ins are not accepted. [facility] will see patients 28 years of age and older. One Wednesday Evening (Monthly: Volunteer Based).  $30 per visit, cash only  [employer] [facility]  [phone] for adults; Children under age 344, call Graduate Pediatric Dentistry at [phone]. Children aged 604-14, please call [phone] to request a pediatric application.  Dental services are provided in all areas of dental care including fillings, crowns  and bridges, complete and partial dentures, implants, gum treatment, root canals, and extractions. Preventive care is also provided. Treatment is provided to both adults and children. Patients are selected via a lottery and there is often a waiting list.   [facility] [address], [place]  [phone] [email or web address]   [facility] [address], [place], [place] [phone], Ext. [phone] Second and Fourth Thursday of each month, opens at 6:30 AM; Clinic ends at 9 AM.  Patients are seen on a first-come first-served basis, and a limited number are seen during each clinic.   [facility]  [address], [place], [place] [phone]   Eligibility Requirements You must have lived in [place], [place], or [place] counties for at least the last three months.   You cannot be eligible for state or federal sponsored [place] Cityhealthcare insurance, including CIGNAVeterans Administration, [place]icaid, or [employer]icare.   You generally cannot be eligible for healthcare insurance through your employer.    How to apply: Eligibility screenings are held every Tuesday and Wednesday afternoon from 1:00 pm until 4:00 pm. You do not need an appointment for the interview!  [facility] [address], [place], [place] [phone]   [facility]  [phone]   [facility]  [phone]   [facility]  [phone]    Behavioral Health Resources in the Community: Intensive Outpatient Programs Organization         Address  Phone  Notes  [facility] [address], [place], [place] [phone]   [facility] Outpatient [address], [place], [place] [phone]   [facility] [address], [place], [place]  [phone]   [facility] [address],  [place], [place] [phone] or [phone]   Substance Abuse  Resources Organization         Address  Phone  Notes  [facility]  [phone]   [facility]  [phone]   [facility]  [phone]   [facility]  [phone]   Residential & Outpatient Substance Abuse Program  [phone]   Psychological Services Organization         Address  Phone  Notes  [facility]  [phone]- [phone]   [facility]  [phone]   [facility] [address], [place] [phone] or [phone]  Mobile Crisis Teams Organization         Address  Phone  Notes  [facility], Mobile Crisis Care Unit  [phone]   [facility]  [address] [place], [place] [phone]   Doristine LocksSharon DeEsch [address] [place] [phone]    Self-Help/Support Groups Organization         Address  Phone             Notes  Mental Health Assoc. of  - variety of support groups  [phone] Call for more information  Narcotics Anonymous (NA), [facility] [address], [place]  2 meetings at this location   Statisticianesidential Treatment Programs Organization         Address  Phone  Notes  [facility] [address],    [place]  [phone]   [facility]  [address], [place] [phone], [place], [place] [phone]   [facility] [address], [place] [phone] Admissions: 8am-3pm M-F  [facility] [address]    [place], [place] [phone]   [facility] [address], [place], [place] [phone]   [facility] [address]  [place], [place] [phone]   [facility] - Intensive Outpatient [address], [place], [place] [phone]   [facility] ([facility]iction Recovery [facility] [address]  [place], [place] [phone] or [phone]   [facility] ([facility]) [address] [place], [place] [phone] Accepts Medicaid  [facility] [address]  [place] [phone] Substance Abuse/Addiction Treatment   [facility] Resources Organization         Address  Phone  Notes  [facility]  [phone]   Angie FavaJulie Brannon, PhD [address], [address] [place], [place]   [phone] or [phone]   [facility]   [address] [place], [place] [phone]   [facility] [address], [place], [place] [phone] Insurance/Medicaid/sponsorship through [facility]  [facility] [address]                                    [place], [place] [phone] Therapy/tele-psych/case  [facility] [address]   [place], [place] [phone]    Dr. Lolly MustacheArfeen  [phone]   [facility]  [employer] [facility] 1) [address], [place] 2) [address], [place] 3)  [address], [place] [phone]  [phone]   [facility] [place] Child Abuse Hotline [phone]) [phone] or [phone] (After Hours)

## 2014-08-16 NOTE — ED Notes (Signed)
Pt c/o abscess to rt hip x 4 days.

## 2014-08-16 NOTE — ED Notes (Signed)
Patient has an abscess to the right hip that is red, inflamed, but not open or draining.

## 2014-08-16 NOTE — ED Provider Notes (Signed)
CSN: [id number]     Arrival date & time 08/16/14  [id number] History   This chart was scribed for non-physician practitioner, [name], NP-C working with Doug SouSam Jacubowitz, MD, by Abel PrestoKara Demonbreun, ED Scribe. This patient was seen in room WTR6/WTR6 and the patient's care was started at 8:11 PM.     Chief Complaint  Patient presents with  . Abscess    Patient is a 29 y.o. male presenting with abscess. The history is provided by the patient. No language interpreter was used.  Abscess Associated symptoms: nausea   Associated symptoms: no fever and no vomiting    HPI Comments: Frank Winters is a 29 y.o. male who presents to the Emergency Department complaining of boil to right hip with onset 4 days ago. Pt states he had 2 smaller similar abscesses to same area prior to onset of this one. He notes associated 9.5/10 pain and nausea. 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 Pt denies fever, chills, diarrhea, and vomiting.     Past Medical History  Diagnosis Date  . Staph infection    No past surgical history on file. No family history on file. History  Substance Use Topics  . Smoking status: Current Every Day Smoker -- 0.50 packs/day    Types: Cigarettes  . Smokeless tobacco: Not on file  . Alcohol Use: 0.0 oz/week     Comment: 1-2 beers/ day    Review of Systems  Constitutional: Negative for fever and chills.  Gastrointestinal: Positive for nausea. Negative for vomiting and diarrhea.  Skin:       boil      Allergies  Review of patient's allergies indicates no known allergies.  Home Medications   Prior to Admission medications   Medication Sig Start Date End Date Taking? Authorizing Provider  acetaminophen (TYLENOL) 500 MG tablet Take 1,000 mg by mouth every 6 (six) hours as needed. For pain.    Historical Provider, MD  cephALEXin (KEFLEX) 500 MG capsule Take 1 capsule (500 mg total) by mouth 4 (four) times daily. 04/05/14   Ladona MowJoe Mintz, PA-C   HYDROcodone-acetaminophen (NORCO/VICODIN) 5-325 MG per tablet Take 1-2 tablets by mouth every 6 (six) hours as needed for moderate pain or severe pain. 04/05/14   Ladona MowJoe Mintz, PA-C  loratadine (CLARITIN) 10 MG tablet Take 10 mg by mouth daily.    Historical Provider, MD  sulfamethoxazole-trimethoprim (BACTRIM DS,SEPTRA DS) 800-160 MG per tablet Take 1 tablet by mouth 2 (two) times daily. One po bid x 7 days Patient not taking: Reported on 04/05/2014 08/18/12   Nada Boozerobyn M Hess, PA-C  sulfamethoxazole-trimethoprim (SEPTRA DS) 800-160 MG per tablet Take 1 tablet by mouth every 12 (twelve) hours. Patient not taking: Reported on 04/05/2014 09/26/13   Dahlia ClientHannah Muthersbaugh, PA-C   BP 121/81 mmHg  Pulse 94  Temp(Src) 98.1 F (36.7 C) (Oral)  Resp 18  SpO2 100% Physical Exam  Constitutional: He is oriented to person, place, and time. He appears well-developed and well-nourished.  HENT:  Head: Normocephalic.  Eyes: Conjunctivae are normal.  Neck: Normal range of motion. Neck supple.  Pulmonary/Chest: Effort normal.  Musculoskeletal: Normal range of motion.  Neurological: He is alert and oriented to person, place, and time.  Skin: Skin is warm and dry.  4 cm ovoid area of redness and induration with 2cm area of fluctuance  Psychiatric: He has a normal mood and affect. His behavior is normal.  Nursing note and vitals reviewed.   ED Course  Procedures (including critical care  time) EMERGENCY DEPARTMENT US SOFT TISSUE INTERPRETATION "Study: Limited Ultrasound of the noted body part in comments below"  INDICATIONS: Pain and Soft tissue infection Multiple views of the body part are obtained with a multi-frequency linear probe  PERFORMED BY:  Myself  IMAGES ARCHIVED?: Yes  SIDE:Right   BODY PART:Lower extremity  FINDINGS: Abcess present and Cellulitis present  LIMITATIONS: None  INTERPRETATION:  Abcess present and Cellulitis present  COMMENT:  N/A   DIAGNOSTIC STUDIES: Oxygen  Saturation is 100% on room air, normal by my interpretation.    COORDINATION OF CARE: 8:14 PM Discussed treatment plan with patient at beside, the patient agrees with the plan and has no further questions at this time.   Labs Review Labs Reviewed - No data to display  Imaging Review No results found.   EKG Interpretation None     INCISION AND DRAINAGE PROCEDURE NOTE: Patient identification was confirmed and verbal consent was obtained. This procedure was performed by Harle BattiestElizabeth Emauri Krygier, NP-C at 9:16 PM. Site: right hip Sterile procedures observed Needle size: 27 guage Anesthetic used (type and amt): 1% lidocaine Blade size: #11 Drainage: purulent drainage Complexity: Complex Site anesthetized, incision made over site, wound drained and explored loculations, rinsed with copious amounts of normal saline, wound packed with sterile gauze, covered with dry, sterile dressing.  Pt tolerated procedure well without complications.  Instructions for care discussed verbally and pt provided with additional written instructions for homecare and f/u.   MDM   Final diagnoses:  Abscess and cellulitis   29 yo with skin abscess amenable to incision and drainage.  Encouraged home warm soaks and flushing and wound re-check in 2 days. Moderate cellulitis noted after abscess drained. Prescription for abx provided. Pt is well-appearing, in no acute distress and vital signs reviewed and not concerning. He appears safe to be discharged.  Return precautions provided. Pt aware of plan and in agreement.   I personally performed the services described in this documentation, which was scribed in my presence. The recorded information has been reviewed and is accurate.  Filed Vitals:   08/16/14 1859 08/16/14 2138  BP: 121/81 109/74  Pulse: 94 75  Temp: 98.1 F (36.7 C) 98.4 F (36.9 C)  TempSrc: Oral Oral  Resp: 18 20  SpO2: 100% 100%   Meds given in ED:  Medications  lidocaine (PF) (XYLOCAINE)  1 % injection 5 mL (5 mLs Intradermal Given by Other 08/16/14 2021)  HYDROcodone-acetaminophen (NORCO/VICODIN) 5-325 MG per tablet 1 tablet (1 tablet Oral Given 08/16/14 2021)    Discharge Medication List as of 08/16/2014  9:28 PM    ' 08/16/14 0000  cephALEXin (KEFLEX) 500 MG capsule 4 times daily Discontinue Reprint 08/16/14 2126   08/16/14 0000  sulfamethoxazole-trimethoprim (BACTRIM DS,SEPTRA DS) 800-160 MG per tablet 2 times daily Discontinue Reprint 08/16/14 2126   08/16/14 0000  HYDROcodone-acetaminophen (NORCO/VICODIN) 5-325 MG per tablet Every 4 hours PRN Discontinue Reprint 08/16/14 2126        Harle BattiestElizabeth Lakiah Dhingra, NP 08/18/14 [id number]  Doug SouSam Jacubowitz, MD 08/19/14 [id number]

## 2015-01-05 ENCOUNTER — Emergency Department (HOSPITAL_COMMUNITY): Payer: Self-pay

## 2015-01-05 ENCOUNTER — Encounter (HOSPITAL_COMMUNITY): Payer: Self-pay

## 2015-01-05 ENCOUNTER — Emergency Department (HOSPITAL_COMMUNITY)
Admission: EM | Admit: 2015-01-05 | Discharge: 2015-01-05 | Disposition: A | Discharge status: Home / Self Care | Payer: Self-pay | Attending: Emergency Medicine | Admitting: Emergency Medicine

## 2015-01-05 DIAGNOSIS — Z8619 Personal history of other infectious and parasitic diseases: Secondary | ICD-10-CM | POA: Insufficient documentation

## 2015-01-05 DIAGNOSIS — Z720 Tobacco use: Secondary | ICD-10-CM | POA: Insufficient documentation

## 2015-01-05 DIAGNOSIS — N492 Inflammatory disorders of scrotum: Secondary | ICD-10-CM | POA: Insufficient documentation

## 2015-01-05 DIAGNOSIS — L0291 Cutaneous abscess, unspecified: Secondary | ICD-10-CM

## 2015-01-05 LAB — URINALYSIS, ROUTINE W REFLEX MICROSCOPIC
Glucose, UA: NEGATIVE mg/dL
Hgb urine dipstick: NEGATIVE
Ketones, ur: NEGATIVE mg/dL
Leukocytes, UA: NEGATIVE
Nitrite: NEGATIVE
Protein, ur: NEGATIVE mg/dL
Specific Gravity, Urine: 1.028 (ref 1.005–1.030)
Urobilinogen, UA: 1 mg/dL (ref 0.0–1.0)
pH: 6.5 (ref 5.0–8.0)

## 2015-01-05 LAB — BASIC METABOLIC PANEL WITH GFR
Anion gap: 5 (ref 5–15)
BUN: 9 mg/dL (ref 6–20)
CO2: 28 mmol/L (ref 22–32)
Calcium: 8.9 mg/dL (ref 8.9–10.3)
Chloride: 104 mmol/L (ref 101–111)
Creatinine, Ser: 0.85 mg/dL (ref 0.61–1.24)
GFR calc Af Amer: >60 mL/min
GFR calc non Af Amer: >60 mL/min
Glucose, Bld: 73 mg/dL (ref 65–99)
Potassium: 3.6 mmol/L (ref 3.5–5.1)
Sodium: 137 mmol/L (ref 135–145)

## 2015-01-05 LAB — CBC WITH DIFFERENTIAL/PLATELET
Basophils Absolute: 0 K/uL (ref 0.0–0.1)
Basophils Relative: 1 %
Eosinophils Absolute: 0.2 K/uL (ref 0.0–0.7)
Eosinophils Relative: 3 %
HCT: 38.5 % — ABNORMAL LOW (ref 39.0–52.0)
Hemoglobin: 13.6 g/dL (ref 13.0–17.0)
Lymphocytes Relative: 39 %
Lymphs Abs: 2.5 K/uL (ref 0.7–4.0)
MCH: 31.5 pg (ref 26.0–34.0)
MCHC: 35.3 g/dL (ref 30.0–36.0)
MCV: 89.1 fL (ref 78.0–100.0)
Monocytes Absolute: 0.4 K/uL (ref 0.1–1.0)
Monocytes Relative: 5 %
Neutro Abs: 3.4 K/uL (ref 1.7–7.7)
Neutrophils Relative %: 52 %
Platelets: 188 K/uL (ref 150–400)
RBC: 4.32 MIL/uL (ref 4.22–5.81)
RDW: 12.5 % (ref 11.5–15.5)
WBC: 6.5 K/uL (ref 4.0–10.5)

## 2015-01-05 MED ORDER — OXYCODONE-ACETAMINOPHEN 5-325 MG PO TABS: 2.0000 | ORAL_TABLET | ORAL | Status: AC | PRN

## 2015-01-05 MED ORDER — OXYCODONE-ACETAMINOPHEN 5-325 MG PO TABS
1.0000 | ORAL_TABLET | Freq: Once | ORAL | Status: AC
  Administered 2015-01-05: 1 via ORAL
  Filled 2015-01-05: qty 1

## 2015-01-05 MED ORDER — CLINDAMYCIN HCL 150 MG PO CAPS: 450.0000 mg | ORAL_CAPSULE | Freq: Three times a day (TID) | ORAL | Status: DC

## 2015-01-05 NOTE — Discharge Instructions (Signed)
Abscess °An abscess is an infected area that contains a collection of pus and debris. It can occur in almost any part of the body. An abscess is also known as a furuncle or boil. °CAUSES  °An abscess occurs when tissue gets infected. This can occur from blockage of oil or sweat glands, infection of hair follicles, or a minor injury to the skin. As the body tries to fight the infection, pus collects in the area and creates pressure under the skin. This pressure causes pain. People with weakened immune systems have difficulty fighting infections and get certain abscesses more often.  °SYMPTOMS °Usually an abscess develops on the skin and becomes a painful mass that is red, warm, and tender. If the abscess forms under the skin, you may feel a moveable soft area under the skin. Some abscesses break open (rupture) on their own, but most will continue to get worse without care. The infection can spread deeper into the body and eventually into the bloodstream, causing you to feel ill.  °DIAGNOSIS  °Your caregiver will take your medical history and perform a physical exam. A sample of fluid may also be taken from the abscess to determine what is causing your infection. °TREATMENT  °Your caregiver may prescribe antibiotic medicines to fight the infection. However, taking antibiotics alone usually does not cure an abscess. Your caregiver may need to make a small cut (incision) in the abscess to drain the pus. In some cases, gauze is packed into the abscess to reduce pain and to continue draining the area. °HOME CARE INSTRUCTIONS  °· Only take over-the-counter or prescription medicines for pain, discomfort, or fever as directed by your caregiver. °· If you were prescribed antibiotics, take them as directed. Finish them even if you start to feel better. °· If gauze is used, follow your caregiver's directions for changing the gauze. °· To avoid spreading the infection: °· Keep your draining abscess covered with a  bandage. °· Wash your hands well. °· Do not share personal care items, towels, or whirlpools with others. °· Avoid skin contact with others. °· Keep your skin and clothes clean around the abscess. °· Keep all follow-up appointments as directed by your caregiver. °SEEK MEDICAL CARE IF:  °· You have increased pain, swelling, redness, fluid drainage, or bleeding. °· You have muscle aches, chills, or a general ill feeling. °· You have a fever. °MAKE SURE YOU:  °· Understand these instructions. °· Will watch your condition. °· Will get help right away if you are not doing well or get worse. °Document Released: 01/04/2005 Document Revised: 09/26/2011 Document Reviewed: 06/09/2011 °ExitCare® Patient Information ©2015 [employer]. This information is not intended to replace advice given to you by your health care provider. Make sure you discuss any questions you have with your health care provider. ° °Cellulitis °Cellulitis is an infection of the skin and the tissue under the skin. The infected area is usually red and tender. This happens most often in the arms and lower legs. °HOME CARE  °· Take your antibiotic medicine as told. Finish the medicine even if you start to feel better. °· Keep the infected arm or leg raised (elevated). °· Put a warm cloth on the area up to 4 times per day. °· Only take medicines as told by your doctor. °· Keep all doctor visits as told. °GET HELP IF: °· You see red streaks on the skin coming from the infected area. °· Your red area gets bigger or turns a dark color. °·   Your bone or joint under the infected area is painful after the skin heals.  Your infection comes back in the same area or different area.  You have a puffy (swollen) bump in the infected area.  You have new symptoms.  You have a fever. GET HELP RIGHT AWAY IF:   You feel very sleepy.  You throw up (vomit) or have watery poop (diarrhea).  You feel sick and have muscle aches and pains. MAKE SURE YOU:    Understand these instructions.  Will watch your condition.  Will get help right away if you are not doing well or get worse. Document Released: 09/13/2007 Document Revised: 08/11/2013 Document Reviewed: 06/12/2011 [facility] Patient Information 2015 [place], [place]. This information is not intended to replace advice given to you by your health care provider. Make sure you discuss any questions you have with your health care provider.  Make appointment to follow-up with urology as soon as possible. Take Atarax as prescribed. Return to the emergency department if you notice redness surrounding the area, drainage, swelling, fevers, difficulty urinating.

## 2015-01-05 NOTE — ED Notes (Signed)
Ultrasound at bedside

## 2015-01-05 NOTE — ED Notes (Signed)
PA at bedside Pt alert and oriented x4. Respirations even and unlabored, bilateral symmetrical rise and fall of chest. Skin warm and dry. In no acute distress. Denies needs.   

## 2015-01-05 NOTE — ED Provider Notes (Signed)
CSN: [id number]     Arrival date & time 01/05/15  1638 History   First MD Initiated Contact with Patient 01/05/15 1737     Chief Complaint  Patient presents with  . Abscess     (Consider location/radiation/quality/duration/timing/severity/associated sxs/prior Treatment) HPI Comments: Frank Winters is a 29 y.o M with a pmhx of of recurrent abscesses presents to the emergency department today complaining of an abscess on his scrotum that is present for 6 weeks. Patient states that 3 weeks ago his anterior is a physician give prescription for Bactrim which she completed. However the abscess has remained present. Patient states history of the abscess was extremely large so he attempted to drain it himself. Today the abscess is smaller but still causing significant pain. Denies fevers, vomiting, dysuria, abdominal pain, back pain, penile discharge.   The history is provided by the patient.    Past Medical History  Diagnosis Date  . Staph infection    History reviewed. No pertinent past surgical history. History reviewed. No pertinent family history. Social History  Substance Use Topics  . Smoking status: Current Every Day Smoker -- 0.50 packs/day    Types: Cigarettes  . Smokeless tobacco: None  . Alcohol Use: No    Review of Systems    Allergies  Review of patient's allergies indicates no known allergies.  Home Medications   Prior to Admission medications   Medication Sig Start Date End Date Taking? Authorizing Provider  acetaminophen (TYLENOL) 500 MG tablet Take 1,000 mg by mouth every 6 (six) hours as needed. For pain.   Yes Historical Provider, MD  cephALEXin (KEFLEX) 500 MG capsule Take 1 capsule (500 mg total) by mouth 4 (four) times daily. Patient not taking: Reported on 01/05/2015 08/16/14   Harle Battiest, NP  HYDROcodone-acetaminophen (NORCO/VICODIN) 5-325 MG per tablet Take 1 tablet by mouth every 4 (four) hours as needed. Patient not taking: Reported on 01/05/2015  08/16/14   Harle Battiest, NP   BP 109/60 mmHg  Pulse 58  Temp(Src) 98.2 F (36.8 C) (Oral)  Resp 14  SpO2 100% Physical Exam  Constitutional: He is oriented to person, place, and time. He appears well-developed and well-nourished. No distress.  HENT:  Head: Normocephalic and atraumatic.  Mouth/Throat: No oropharyngeal exudate.  Eyes: Conjunctivae and EOM are normal. Pupils are equal, round, and reactive to light. Right eye exhibits no discharge. Left eye exhibits no discharge. No scleral icterus.  Cardiovascular: Normal rate, regular rhythm, normal heart sounds and intact distal pulses.  Exam reveals no gallop and no friction rub.   No murmur heard. Pulmonary/Chest: Effort normal and breath sounds normal. No respiratory distress. He has no wheezes. He has no rales. He exhibits no tenderness.  Abdominal: Soft. Bowel sounds are normal. He exhibits no distension and no mass. There is no tenderness. There is no rebound and no guarding.  Genitourinary: Penis normal.  2 cm erythematous area of indurated tissue on the right side of the scrotum. No drainage. Non-fluctuant.   Musculoskeletal: Normal range of motion. He exhibits no edema.  Neurological: He is alert and oriented to person, place, and time. No cranial nerve deficit.  Strength 5/5 throughout. No sensory deficits.    Skin: Skin is warm and dry. No rash noted. He is not diaphoretic. No erythema. No pallor.  Psychiatric: He has a normal mood and affect. His behavior is normal.  Nursing note and vitals reviewed.   ED Course  Procedures (including critical care time) Labs Review Labs Reviewed  CBC  WITH DIFFERENTIAL/PLATELET - Abnormal; Notable for the following:    HCT 38.5 (*)    All other components within normal limits  URINALYSIS, ROUTINE W REFLEX MICROSCOPIC (NOT AT [facility]) - Abnormal; Notable for the following:    Bilirubin Urine SMALL (*)    All other components within normal limits  BASIC METABOLIC PANEL    Imaging  Review US Scrotum  01/05/2015   CLINICAL DATA:  Right groin and scrotal pain with abscess for 3 days. History of multiple abscess. Pain lateral to the right scrotum, patient reports drainage yesterday.  EXAM: ULTRASOUND OF SCROTUM  TECHNIQUE: Complete ultrasound examination of the testicles, epididymis, and other scrotal structures was performed.  COMPARISON:  None.  FINDINGS: Right testicle  Measurements: 5.3 x 2.4 x 3.0 cm. There is a single 4 mm shadowing calcification. No intratesticular mass or fluid collection. Normal blood flow seen.  Left testicle  Measurements: 4.9 x 3.8 x 3.7 cm. No mass or microlithiasis visualized. No focal fluid collection. Normal blood flow seen.  Right epididymis: Normal in size and appearance. Normal blood flow.  Left epididymis: Small 5 mm epididymal head cyst. Otherwise normal in size and appearance. Normal blood flow seen.  Hydrocele:  None visualized.  Varicocele:  Small bilateral.  Other:  Sonographic evaluation in the area of pain and drainage demonstrates subcutaneous soft tissue edema. There is no soft tissue abscess.  IMPRESSION: 1. No evidence of scrotal abscess. There is soft tissue edema in the region of pain adjacent to the right scrotum. 2. Single 4 mm calcification in the right testicle. 3. Bilateral varicoceles.   Electronically Signed   By: Rubye Oaks M.D.   On: 01/05/2015 19:41   I have personally reviewed and evaluated these images and lab results as part of my medical decision-making.   EKG Interpretation None      MDM   Final diagnoses:  Abscess    Patient presents for right scrotal abscess. On physical exam there is an area of erythematous indurated tissue to the right scrotum. Area is nonfluctuant. No drainage seen. Patient states he drained the abscess himself yesterday drawing copious amounts of fluid. Today the area is much smaller. Do not think. Needs to be lanced at this time. We'll give antibiotics.  Scrotal ultrasound revealed 4  mm calculus in right scrotum. Recommend follow-up with urology.  VSS. Discussed treatment plan with pt who is agreeable. Return precautions outlined in patient discharge instructions.      Lester Kinsman [place], PA-C 01/06/15 0021  Lyndal Pulley, MD 01/06/15 [phone]

## 2015-01-05 NOTE — ED Notes (Signed)
Patient reports an abscess to the right scrotum. Patient states he had antibiotics 2 weeks ago which including Bactrim and another antibiotic. Patient states the abscess is worse.

## 2015-06-23 ENCOUNTER — Encounter (HOSPITAL_COMMUNITY): Payer: Self-pay | Admitting: Emergency Medicine

## 2015-06-23 ENCOUNTER — Emergency Department (HOSPITAL_COMMUNITY)
Admission: EM | Admit: 2015-06-23 | Discharge: 2015-06-23 | Disposition: A | Discharge status: Home / Self Care | Payer: Medicaid Other | Attending: Emergency Medicine | Admitting: Emergency Medicine

## 2015-06-23 DIAGNOSIS — K0889 Other specified disorders of teeth and supporting structures: Secondary | ICD-10-CM | POA: Diagnosis present

## 2015-06-23 DIAGNOSIS — F17210 Nicotine dependence, cigarettes, uncomplicated: Secondary | ICD-10-CM | POA: Diagnosis not present

## 2015-06-23 DIAGNOSIS — K029 Dental caries, unspecified: Secondary | ICD-10-CM | POA: Diagnosis not present

## 2015-06-23 DIAGNOSIS — K047 Periapical abscess without sinus: Secondary | ICD-10-CM

## 2015-06-23 DIAGNOSIS — Z792 Long term (current) use of antibiotics: Secondary | ICD-10-CM | POA: Diagnosis not present

## 2015-06-23 DIAGNOSIS — K002 Abnormalities of size and form of teeth: Secondary | ICD-10-CM | POA: Diagnosis not present

## 2015-06-23 DIAGNOSIS — Z8619 Personal history of other infectious and parasitic diseases: Secondary | ICD-10-CM | POA: Diagnosis not present

## 2015-06-23 MED ORDER — HYDROCODONE-ACETAMINOPHEN 5-325 MG PO TABS: 2.0000 | ORAL_TABLET | ORAL | Status: AC | PRN

## 2015-06-23 MED ORDER — PENICILLIN V POTASSIUM 500 MG PO TABS: 500.0000 mg | ORAL_TABLET | Freq: Four times a day (QID) | ORAL | Status: AC

## 2015-06-23 NOTE — Discharge Instructions (Signed)
Dental Extraction A dental extraction is the removal (extraction) of a tooth. You may need to have a dental extraction if:   You have tooth decay or gum disease.  You have an infection (abscess).  Room needs to be made for other teeth to grow in or to be aligned properly.  Baby (primary) teeth are preventing adult (permanent) teeth from coming to the surface (erupting).  You have a tooth fracture or fractures that are not repairable.  You are going to be having radiation to your head and neck. The type and length of procedure that you have depends on the reason for the extraction and the placement of the tooth or teeth that are being removed. The procedure may be:  A simple extraction. This is done if the tooth is visible in the mouth and is above the gumline.  A surgical extraction. This is done if the tooth has not come into the mouth or if the tooth is broken off below the gumline. LET [facility] PROVIDER KNOW ABOUT:  Any allergies you have.  All medicines you are taking, including vitamins, herbs, eye drops, creams, and over-the-counter medicines.  Previous problems you or members of your family have had with the use of anesthetics.  Any blood disorders you have.  Previous surgeries you have had.  Any medical conditions you may have. RISKS AND COMPLICATIONS Generally, this is a safe procedure. However, problems may occur, including:  Damage to surrounding teeth, nerves, tissues, or structures.  The blood clot does not form or stay in place where the tooth was removed. This causes the bones and nerves underneath to be exposed (dry socket). This can delay healing.  Incomplete extraction of roots.  Jawbone injury, pain, or weakness. BEFORE THE PROCEDURE  Ask your health care provider about:  Changing or stopping your regular medicines. This is especially important if you are taking diabetes medicines or blood thinners.  Taking medicines such as aspirin and  ibuprofen. These medicines can thin your blood. Do not take these medicines before your procedure if your health care provider instructs you not to.  Take medicines, such as antibiotic medicines, as directed by your health care provider.  Follow instructions from your health care provider about eating or drinking restrictions.  Plan to have someone take you home after the procedure.  If you go home right after the procedure, plan to have someone with you for 24 hours. PROCEDURE  You may be given one or more of the following:  A medicine that helps you relax (sedative).  A medicine that numbs the area (local anesthetic).  A medicine that makes you fall asleep (general anesthetic).  If you are having a simple extraction:  Your dentist will loosen the tooth with an instrument called an elevator.  Another instrument called forceps will be used to grasp the tooth and remove it from the socket.  The open socket will be cleaned.  Gauze will be placed in the socket to reduce bleeding.  If you are having a surgical extraction:  Your dentist will make an incision in the gum.  Some of the bone around the tooth may need to be removed.  The tooth will be removed.  Stitches (sutures) may be required to close the area. The procedure may vary among health care providers and hospitals. AFTER THE PROCEDURE  You may have gauze in your mouth where the tooth was removed. If directed by your health care provider, apply gentle pressure on the gauze for  up to one hour after the procedure. This will help to control bleeding.  A blood clot should begin to form over the open socket. This is normal. Do not touch the area, and do not rinse it.  You may be given medicines to help control pain and help your recovery.   This information is not intended to replace advice given to you by your health care provider. Make sure you discuss any questions you have with your health care provider.  [employer] is a great source of information about community services available.  Access by dialing 2-1-1 from anywhere in [place], or by website -  PooledIncome.pl.   Other Local Resources (Updated 04/2015)  Dental  Care   Services    Phone Number and Address  Cost  [facility] For children 53 - 35 years of age:   Cleaning  Tooth brushing/flossing instruction  Sealants, fillings, crowns  Extractions  Emergency treatment  [phone] [address] [place], [place] [address] Charges based on family income.  Medicaid and some insurance plans accepted.     [facility] - [facility], fillings, crowns  Extractions  Emergency treatment [phone] [address] [place], [place]  Pregnant women 63 years of age or older with a Medicaid card  [facility] - [place]  Cleaning  Sealants, fillings, crowns  Extractions  Emergency treatment [phone] [address] [place], [place] Pregnant women 88 years of age or older with a Medicaid card  [facility] Department of Health - [facility] For children 76 - 73 years of age:   Cleaning  Tooth brushing/flossing instruction  Sealants, fillings, crowns  Extractions  Emergency treatment Limited orthodontic services for patients with Medicaid [phone] [address] [place], [place] [address] Medicaid and [facility] Health Choice cover for children up to age 65 and pregnant women.  Parents of children up to age 15 without Medicaid pay a reduced fee at time of service.  [facility] Department of [employer] For children 99 - 4 years of age:   Cleaning  Tooth brushing/flossing instruction  Sealants, fillings, crowns  Extractions  Emergency treatment Limited orthodontic services for patients with Medicaid [phone] [address] [place], [place].  Medicaid and [place] Health Choice cover for children up to age 65 and pregnant women.  Parents of children up to age 73 without Medicaid pay a reduced fee.  [facility]  Sealants, fillings, crowns  Extractions  Hours: Tuesdays and Thursdays, 4:15 - 8 pm [phone] [address] [place], [place] [address] Services free of charge to [facility] residents ages 18-64 who do not have health insurance, Medicare, [place], or [place] benefits and fall within federal poverty guidelines  [employer]    Provides dental care in addition to primary medical care, nutritional counseling, and pharmacy:  Nurse, mental health, fillings, crowns  Extractions                  [phone] [facility], [address] [place], [place]  [phone] Phineas Real [facility] [place], [address] [place] [place], [place]  [phone] [facility] [place], [place]  [phone] [facility], [address] [place], [place]  [phone] [facility] [address] [place], [place]  Accepts Medicaid, Medicare, most insurance.  Also provides services available to all with fees adjusted based on ability to pay.    [facility]  Cleaning  Tooth brushing/flossing instruction  Sealants, fillings, crowns  Extractions  Emergency treatment Hours: Tuesdays, Thursdays, and Fridays from 8 am to 5 pm by appointment only. [phone] [address] [place], [place] [address] [facility] residents with Medicaid (depending on eligibility) and children with [facility] Health Choice - call for more information.  [facility]  Extractions only  Hours: 2nd and 4th Thursday of each month from 6:30 am - 9 am.   [phone] [address] [place], [place] [address]  Ages 84 and older only.  Patients are seen on a first come, first served basis.  [employer]  [employer]  Extractions  Orthodontics  Endodontics  Implants/Crowns/Bridges  Complete and partial dentures [phone] [place],  Patients must complete an application for services.  There is often a waiting list.     Follow-up with a dentist as soon as possible for consultation and tooth extraction. Take antibiotics as prescribed. Return to the emergency department if you experience severe worsening of your symptoms, facial swelling, fever, difficulty breathing or swallowing.

## 2015-06-23 NOTE — ED Provider Notes (Signed)
CSN: [id number]     Arrival date & time 06/23/15  1815 History  By signing my name below, I, Frank Winters, attest that this documentation has been prepared under the direction and in the presence of [employer], PA-C. Electronically Signed: Budd Winters, ED Scribe. 06/23/2015. 6:58 PM.      Chief Complaint  Patient presents with  . Dental Pain   The history is provided by the patient. No language interpreter was used.   HPI Comments: Frank Winters is a 30 y.o. male smoker at 0.5 ppd with a PMHx of staph infection who presents to the Emergency Department complaining of constant, right-sided upper and lower dental pain radiating along the jaw and worsening significantly as of 2 weeks ago. Pt states he has a tooth on the top and bottom right, each with cavities and one of which has partially broken off. He has not yet seen a dentist for this because he does not have insurance. He notes he has been unable to eat on that side, and that the pain is now preventing him from sleeping. He notes a PMHx of frequent abscesses, for which he is usually placed on antibiotics. Pt denies fever.   Past Medical History  Diagnosis Date  . Staph infection    History reviewed. No pertinent past surgical history. No family history on file. Social History  Substance Use Topics  . Smoking status: Current Every Day Smoker -- 0.50 packs/day    Types: Cigarettes  . Smokeless tobacco: None  . Alcohol Use: No    Review of Systems  Constitutional: Negative for fever.  HENT: Positive for dental problem.   All other systems reviewed and are negative.   Allergies  Review of patient's allergies indicates no known allergies.  Home Medications   Prior to Admission medications   Medication Sig Start Date End Date Taking? Authorizing Provider  acetaminophen (TYLENOL) 500 MG tablet Take 1,000 mg by mouth every 6 (six) hours as needed. For pain.    Historical Provider, MD  cephALEXin (KEFLEX) 500 MG  capsule Take 1 capsule (500 mg total) by mouth 4 (four) times daily. Patient not taking: Reported on 01/05/2015 08/16/14   Harle Battiest, NP  clindamycin (CLEOCIN) 150 MG capsule Take 3 capsules (450 mg total) by mouth 3 (three) times daily. 01/05/15   Rucha Wissinger Tripp Katianna Mcclenney, PA-C  HYDROcodone-acetaminophen (NORCO/VICODIN) 5-325 MG per tablet Take 1 tablet by mouth every 4 (four) hours as needed. Patient not taking: Reported on 01/05/2015 08/16/14   Harle Battiest, NP  HYDROcodone-acetaminophen (NORCO/VICODIN) 5-325 MG tablet Take 2 tablets by mouth every 4 (four) hours as needed. 06/23/15   Rayla Pember Tripp Kadra Kohan, PA-C  oxyCODONE-acetaminophen (PERCOCET/ROXICET) 5-325 MG tablet Take 2 tablets by mouth every 4 (four) hours as needed for severe pain. 01/05/15   Abelina Ketron Tripp Tavian Callander, PA-C  penicillin v potassium (VEETID) 500 MG tablet Take 1 tablet (500 mg total) by mouth 4 (four) times daily. 06/23/15 06/30/15  Knowledge Escandon Tripp Sheryll Dymek, PA-C   BP 123/81 mmHg  Pulse 67  Temp(Src) 98.2 F (36.8 C) (Oral)  Resp 16  SpO2 100% Physical Exam  Constitutional: He is oriented to person, place, and time. He appears well-developed and well-nourished. No distress.  HENT:  Head: Normocephalic and atraumatic.  Mouth/Throat: Oropharynx is clear and moist and mucous membranes are normal. No oral lesions. No trismus in the jaw. Abnormal dentition. Dental caries present. No dental abscesses or uvula swelling. No oropharyngeal exudate.    Eyes: Conjunctivae are normal. Right  eye exhibits no discharge. Left eye exhibits no discharge. No scleral icterus.  Neck:  No meningismus  Cardiovascular: Normal rate.   Pulmonary/Chest: Effort normal.  Lymphadenopathy:    He has no cervical adenopathy.  Neurological: He is alert and oriented to person, place, and time. Coordination normal.  Skin: Skin is warm and dry. No rash noted. He is not diaphoretic. No erythema. No pallor.  Psychiatric: He has a normal mood and  affect. His behavior is normal.  Nursing note and vitals reviewed.   ED Course  Procedures  DIAGNOSTIC STUDIES: Oxygen Saturation is 100% on RA, normal by my interpretation.    COORDINATION OF CARE: 6:49 PM - Discussed plans to order antibiotics and to refer pt to a dentist for recommended tooth extraction. Pt advised of plan for treatment and pt agrees.  Labs Review Labs Reviewed - No data to display  Imaging Review No results found. I have personally reviewed and evaluated these images and lab results as part of my medical decision-making.   EKG Interpretation None      MDM   Final diagnoses:  Dental infection   Patient with dentalgia.  No abscess requiring immediate incision and drainage.  Exam not concerning for Ludwig's angina or pharyngeal abscess.  Will treat with penicillin and pain medication. Pt instructed to follow-up with dentist.  Discussed return precautions. Pt safe for discharge.  I personally performed the services described in this documentation, which was scribed in my presence. The recorded information has been reviewed and is accurate.     Lester KinsmanSamantha Tripp EphrataDowless, PA-C 06/24/15 1645  Mancel BaleElliott Wentz, MD 06/25/15 [phone]

## 2015-06-23 NOTE — ED Notes (Signed)
Per pt, states dental pain upper and lower for weeks-thinks teeth are infected

## 2015-10-06 ENCOUNTER — Emergency Department (HOSPITAL_COMMUNITY)
Admission: EM | Admit: 2015-10-06 | Discharge: 2015-10-06 | Disposition: A | Discharge status: Home / Self Care | Payer: Medicaid Other | Attending: Emergency Medicine | Admitting: Emergency Medicine

## 2015-10-06 ENCOUNTER — Encounter (HOSPITAL_COMMUNITY): Payer: Self-pay

## 2015-10-06 DIAGNOSIS — K029 Dental caries, unspecified: Secondary | ICD-10-CM | POA: Diagnosis not present

## 2015-10-06 DIAGNOSIS — K047 Periapical abscess without sinus: Secondary | ICD-10-CM | POA: Diagnosis not present

## 2015-10-06 DIAGNOSIS — K0889 Other specified disorders of teeth and supporting structures: Secondary | ICD-10-CM | POA: Diagnosis present

## 2015-10-06 DIAGNOSIS — Z79899 Other long term (current) drug therapy: Secondary | ICD-10-CM | POA: Diagnosis not present

## 2015-10-06 DIAGNOSIS — F17210 Nicotine dependence, cigarettes, uncomplicated: Secondary | ICD-10-CM | POA: Diagnosis not present

## 2015-10-06 MED ORDER — NAPROXEN 500 MG PO TABS: 500.0000 mg | ORAL_TABLET | Freq: Two times a day (BID) | ORAL | Status: DC | PRN

## 2015-10-06 MED ORDER — DOXYCYCLINE HYCLATE 100 MG PO CAPS: 100.0000 mg | ORAL_CAPSULE | Freq: Two times a day (BID) | ORAL | Status: AC

## 2015-10-06 NOTE — ED Provider Notes (Signed)
CSN: [id number]     Arrival date & time 10/06/15  1743 History  By signing my name below, I, Placido SouLogan Joldersma, attest that this documentation has been prepared under the direction and in the presence of Bladimir Auman Camprubi-Soms, PA-C. Electronically Signed: Placido SouLogan Joldersma, ED Scribe. 10/06/2015. 7:00 PM.   Chief Complaint  Patient presents with  . Dental Problem   Patient is a 30 y.o. male presenting with tooth pain. The history is provided by the patient. No language interpreter was used.  Dental Pain Location:  Lower and upper Upper teeth location:  1/RU 3rd molar Lower teeth location:  32/RL 3rd molar Quality:  Sharp Severity:  Moderate Onset quality:  Gradual Duration:  4 days Timing:  Constant Progression:  Waxing and waning Chronicity:  Chronic Context: dental fracture and poor dentition   Previous work-up:  Dental exam Relieved by:  NSAIDs Worsened by:  Touching and pressure Ineffective treatments:  Topical anesthetic gel Associated symptoms: gum swelling   Associated symptoms: no difficulty swallowing, no drooling, no facial swelling, no fever, no neck pain, no neck swelling, no oral bleeding and no trismus   Risk factors: lack of dental care   Risk factors: no immunosuppression     HPI Comments: Frank Winters is a 30 y.o. male who presents to the Emergency Department complaining of acute worsening of chronic dental pain which worsened 4 days ago. Describes the pain as constant, waxing and waning, sharp, 8/10, non-radiating, right upper/lower dental pain which is worse with chewing on that side, unrelieved with orajel, and mildly improved with ibuprofen. Pt reports a hx of broken teeth in the region and has a consultation scheduled with an oral surgeon on 10/19/2015 to have the affected teeth extracted. Pt reports associated mild gum swelling in the affected region. Pt denies any known drug allergies. He denies neck swelling, trismus, drooling, difficulty swallowing, gum/dental  drainage, ear pain, ear drainage, fevers or chills, CP, SOB, abd pain, n/v/d/c, dysuria, hematuria, numbness, tingling, weakness, arthralgias, and myalgias.    Past Medical History  Diagnosis Date  . Staph infection    History reviewed. No pertinent past surgical history. No family history on file. Social History  Substance Use Topics  . Smoking status: Current Every Day Smoker -- 0.50 packs/day    Types: Cigarettes  . Smokeless tobacco: None  . Alcohol Use: No    Review of Systems  Constitutional: Negative for fever and chills.  HENT: Positive for dental problem. Negative for drooling, ear discharge, ear pain, facial swelling and trouble swallowing.   Respiratory: Negative for shortness of breath.   Cardiovascular: Negative for chest pain.  Gastrointestinal: Negative for nausea, vomiting, abdominal pain, diarrhea and constipation.  Genitourinary: Negative for dysuria and hematuria.  Musculoskeletal: Negative for myalgias, arthralgias and neck pain.  Skin: Negative for color change.  Allergic/Immunologic: Negative for immunocompromised state.  Neurological: Negative for weakness and numbness.  Psychiatric/Behavioral: Negative for confusion.  10 Systems reviewed and are negative for acute change except as noted in the HPI.    Allergies  Review of patient's allergies indicates no known allergies.  Home Medications   Prior to Admission medications   Medication Sig Start Date End Date Taking? Authorizing Provider  acetaminophen (TYLENOL) 500 MG tablet Take 1,000 mg by mouth every 6 (six) hours as needed. For pain.    Historical Provider, MD  cephALEXin (KEFLEX) 500 MG capsule Take 1 capsule (500 mg total) by mouth 4 (four) times daily. Patient not taking: Reported on 01/05/2015 08/16/14  Harle Battiest, NP  clindamycin (CLEOCIN) 150 MG capsule Take 3 capsules (450 mg total) by mouth 3 (three) times daily. 01/05/15   Samantha Tripp Dowless, PA-C  HYDROcodone-acetaminophen  (NORCO/VICODIN) 5-325 MG per tablet Take 1 tablet by mouth every 4 (four) hours as needed. Patient not taking: Reported on 01/05/2015 08/16/14   Harle Battiest, NP  HYDROcodone-acetaminophen (NORCO/VICODIN) 5-325 MG tablet Take 2 tablets by mouth every 4 (four) hours as needed. 06/23/15   Samantha Tripp Dowless, PA-C  oxyCODONE-acetaminophen (PERCOCET/ROXICET) 5-325 MG tablet Take 2 tablets by mouth every 4 (four) hours as needed for severe pain. 01/05/15   Samantha Tripp Dowless, PA-C   BP 120/79 mmHg  Pulse 78  Temp(Src) 98.7 F (37.1 C) (Oral)  Resp 18  SpO2 98%  Physical Exam  Constitutional: He is oriented to person, place, and time. Vital signs are normal. He appears well-developed and well-nourished.  Non-toxic appearance. No distress.  Afebrile, nontoxic, NAD  HENT:  Head: Normocephalic and atraumatic.  Nose: Nose normal.  Mouth/Throat: Uvula is midline, oropharynx is clear and moist and mucous membranes are normal. No trismus in the jaw. Dental caries present. No dental abscesses or uvula swelling.    Right upper and lower molars number 1 and 32 decayed and mildly TTP, with on surrounding gingival swelling or erythema, no evidence of abscess or ludwigs. Nose clear. Oropharynx clear and moist, without uvular swelling or deviation, no trismus or drooling, no tonsillar swelling or erythema, no exudates.    Eyes: Conjunctivae and EOM are normal. Right eye exhibits no discharge. Left eye exhibits no discharge.  Neck: Normal range of motion. Neck supple.  Cardiovascular: Normal rate.   Pulmonary/Chest: Effort normal. No respiratory distress.  Abdominal: Normal appearance. He exhibits no distension.  Musculoskeletal: Normal range of motion.  Lymphadenopathy:       Head (right side): Submandibular adenopathy present.  Reactive right sided submandibular LAD which is non-TTP.   Neurological: He is alert and oriented to person, place, and time. He has normal strength. No sensory deficit.   Skin: Skin is warm, dry and intact. No rash noted.  Psychiatric: He has a normal mood and affect.  Nursing note and vitals reviewed.   ED Course  Procedures  DIAGNOSTIC STUDIES: Oxygen Saturation is 98% on RA, normal by my interpretation.    COORDINATION OF CARE: 6:55 PM Discussed next steps with pt. Pt verbalized understanding and is agreeable with the plan.   Labs Review Labs Reviewed - No data to display  Imaging Review No results found.   EKG Interpretation None      MDM   Final diagnoses:  Pain due to dental caries  Dental infection    30 y.o. male here with Dental pain associated with dental decay and possible dental infection with patient afebrile, non toxic appearing and swallowing secretions well. No ludwig's or airway issues. He has oral surgeon f/up, stressed the importance of dental follow up for ultimate management of dental pain.  I have also discussed reasons to return immediately to the ER.  Patient expresses understanding and agrees with plan.  I will also give doxycycline and pain control.   I personally performed the services described in this documentation, which was scribed in my presence. The recorded information has been reviewed and is accurate.  BP 120/79 mmHg  Pulse 78  Temp(Src) 98.7 F (37.1 C) (Oral)  Resp 18  SpO2 98%  Meds ordered this encounter  Medications  . doxycycline (VIBRAMYCIN) 100 MG capsule  Sig: Take 1 capsule (100 mg total) by mouth 2 (two) times daily. One po bid x 7 days    Dispense:  14 capsule    Refill:  0    Order Specific Question:  Supervising Provider    Answer:  MILLER, BRIAN [[id number]]  . naproxen (NAPROSYN) 500 MG tablet    Sig: Take 1 tablet (500 mg total) by mouth 2 (two) times daily as needed for mild pain, moderate pain or headache (TAKE WITH MEALS.).    Dispense:  20 tablet    Refill:  0    Order Specific Question:  Supervising Provider    Answer:  Eber HongMILLER, BRIAN [[id number]]      Keamber Macfadden Camprubi-Soms,  PA-C 10/06/15 1905  Gwyneth SproutWhitney Plunkett, MD 10/08/15 [id number]

## 2015-10-06 NOTE — Discharge Instructions (Signed)
Apply warm compresses to jaw throughout the day. Take antibiotic until finished. Use tylenol or naprosyn as needed for pain. Perform salt water swishes to help with pain/swelling. Followup with a dentist is very important for ongoing evaluation and management of recurrent dental pain, call the dentist listed above in the next 24-48 hours to schedule ongoing dental care, or use the list below to find a dentist. Follow up with your oral surgeon for ongoing management of your dental issues. Return to emergency department for emergent changing or worsening symptoms.   Dental Caries Dental caries is tooth decay. This decay can cause a hole in teeth (cavity) that can get bigger and deeper over time. HOME CARE  Brush and floss your teeth. Do this at least two times a day.  Use a fluoride toothpaste.  Use a mouth rinse if told by your dentist or doctor.  Eat less sugary and starchy foods. Drink less sugary drinks.  Avoid snacking often on sugary and starchy foods. Avoid sipping often on sugary drinks.  Keep regular checkups and cleanings with your dentist.  Use fluoride supplements if told by your dentist or doctor.  Allow fluoride to be applied to teeth if told by your dentist or doctor.   This information is not intended to replace advice given to you by your health care provider. Make sure you discuss any questions you have with your health care provider.   Document Released: 01/04/2008 Document Revised: 04/17/2014 Document Reviewed: 03/29/2012 Elsevier Interactive Patient Education 2016 Elsevier Inc.  Dental Pain Dental pain may be caused by many things, including:  Tooth decay (cavities or caries). Cavities expose the nerve of your tooth to air and hot or cold temperatures. This can cause pain or discomfort.  Abscess or infection. A dental abscess is a collection of infected pus from a bacterial infection in the inner part of the tooth (pulp). It usually occurs at the end of the  tooth's root.  Injury.  An unknown reason (idiopathic). Your pain may be mild or severe. It may only occur when:  You are chewing.  You are exposed to hot or cold temperature.  You are eating or drinking sugary foods or beverages, such as soda or candy. Your pain may also be constant. HOME CARE INSTRUCTIONS Watch your dental pain for any changes. The following actions may help to lessen any discomfort that you are feeling:  Take medicines only as directed by your dentist.  If you were prescribed an antibiotic medicine, finish all of it even if you start to feel better.  Keep all follow-up visits as directed by your dentist. This is important.  Do not apply heat to the outside of your face.  Rinse your mouth or gargle with salt water if directed by your dentist. This helps with pain and swelling.  You can make salt water by adding  tsp of salt to 1 cup of warm water.  Apply ice to the painful area of your face:  Put ice in a plastic bag.  Place a towel between your skin and the bag.  Leave the ice on for 20 minutes, 2-3 times per day.  Avoid foods or drinks that cause you pain, such as:  Very hot or very cold foods or drinks.  Sweet or sugary foods or drinks. SEEK MEDICAL CARE IF:  Your pain is not controlled with medicines.  Your symptoms are worse.  You have new symptoms. SEEK IMMEDIATE MEDICAL CARE IF:  You are unable to open your  mouth.  You are having trouble breathing or swallowing.  You have a fever.  Your face, neck, or jaw is swollen.   This information is not intended to replace advice given to you by your health care provider. Make sure you discuss any questions you have with your health care provider.   Document Released: 03/27/2005 Document Revised: 08/11/2014 Document Reviewed: 03/23/2014 Elsevier Interactive Patient Education 2016 [employer]   Emergency Department Resource Guide 1) Find a Doctor and Pay Out of Pocket Although you  won't have to find out who is covered by your insurance plan, it is a good idea to ask around and get recommendations. You will then need to call the office and see if the doctor you have chosen will accept you as a new patient and what types of options they offer for patients who are self-pay. Some doctors offer discounts or will set up payment plans for their patients who do not have insurance, but you will need to ask so you aren't surprised when you get to your appointment.  2) Contact Your Local Health Department Not all health departments have doctors that can see patients for sick visits, but many do, so it is worth a call to see if yours does. If you don't know where your local health department is, you can check in your phone book. The CDC also has a tool to help you locate your state's health department, and many state websites also have listings of all of their local health departments.  3) Find a Walk-in Clinic If your illness is not likely to be very severe or complicated, you may want to try a walk in clinic. These are popping up all over the country in pharmacies, drugstores, and shopping centers. They're usually staffed by nurse practitioners or physician assistants that have been trained to treat common illnesses and complaints. They're usually fairly quick and inexpensive. However, if you have serious medical issues or chronic medical problems, these are probably not your best option.  No Primary Care Doctor: - Call Health Connect at  [phone] - they can help you locate a primary care doctor that  accepts your insurance, provides certain services, etc. - Physician Referral Service- [phone]  Chronic Pain Problems: Organization         Address  Phone   Notes  [facility]  [phone] Patients need to be referred by their primary care doctor.   Medication Assistance: Organization         Address  Phone   Notes  [facility] [address] [place], [place] [address] [phone] --Must be a resident of [facility] -- Must have NO insurance coverage whatsoever (no Medicaid/ Medicare, etc.) -- The pt. MUST have a primary care doctor that directs their care regularly and follows them in the community   [facility]  [phone]   [employer]  [phone]     Dental Care: Organization         Address  Phone  Notes  [facility] [address], [place] [phone] Accepts children up to age 29 who are enrolled in [place] or [place] Health Choice; pregnant women with a Medicaid card; and children who have applied for Medicaid or [place] Health Choice, but were declined, whose parents can pay a reduced fee at time of service.  [facility] [place]  [phone]  [address], [facility] [phone] Accepts children up to age 29 who are enrolled in Medicaid or [place] Health Choice; pregnant women with a Medicaid card; and children who have applied for Medicaid or [place] Health Choice, but were declined, whose parents can pay a reduced fee at time of service.  [facility]  [address], [place] [phone] Patients are seen by appointment only. Walk-ins are not accepted. [facility] will see patients 29 years of age and older. Monday - Tuesday (8am-5pm) Most Wednesdays (8:30-5pm) $30 per visit, cash only  [facility]  [address], [facility] [phone] Patients are seen by appointment only. Walk-ins are not accepted. [facility] will see patients 29 years of age and older. One Wednesday Evening (Monthly: Volunteer Based).  $30 per visit, cash only  [facility]  [phone] for adults; Children under age 644, call Graduate Pediatric Dentistry at [phone]. Children aged 94-14, please call [phone] to request  a pediatric application.  Dental services are provided in all areas of dental care including fillings, crowns and bridges, complete and partial dentures, implants, gum treatment, root canals, and extractions. Preventive care is also provided. Treatment is provided to both adults and children. Patients are selected via a lottery and there is often a waiting list.   [facility] [address], [place]  [phone] [email or web address]   [facility] [address], [place], [place] [phone], Ext. [phone] Second and Fourth Thursday of each month, opens at 6:30 AM; Clinic ends at 9 AM.  Patients are seen on a first-come first-served basis, and a limited number are seen during each clinic.   [facility]  [address], [place], [place] [phone]   Eligibility Requirements You must have lived in [place], [place], or [place] counties for at least the last three months.   You cannot be eligible for state or federal sponsored [place]healthcare insurance, including [employer]ans [employer], [place]icaid, or [employer]icare.   You generally cannot be eligible for healthcare insurance through your employer.    How to apply: Eligibility screenings are held every Tuesday and Wednesday afternoon from 1:00 pm until 4:00 pm. You do not need an appointment for the interview!  [facility] [address], [place], [place] [phone]   [facility]  [phone]   [facility]  [phone]   [facility]  [phone]

## 2015-10-06 NOTE — ED Notes (Signed)
Patient here with right sided upper/lower dental and gum pain. States is to have teeth pulled but has become severe the past few days

## 2016-02-14 ENCOUNTER — Encounter (HOSPITAL_COMMUNITY): Payer: Self-pay | Admitting: *Deleted

## 2016-02-14 ENCOUNTER — Emergency Department (HOSPITAL_COMMUNITY)
Admission: EM | Admit: 2016-02-14 | Discharge: 2016-02-14 | Disposition: A | Discharge status: Home / Self Care | Payer: Medicaid Other | Attending: Emergency Medicine | Admitting: Emergency Medicine

## 2016-02-14 DIAGNOSIS — K0889 Other specified disorders of teeth and supporting structures: Secondary | ICD-10-CM | POA: Insufficient documentation

## 2016-02-14 DIAGNOSIS — F17210 Nicotine dependence, cigarettes, uncomplicated: Secondary | ICD-10-CM | POA: Insufficient documentation

## 2016-02-14 MED ORDER — CLINDAMYCIN HCL 150 MG PO CAPS: 450.0000 mg | ORAL_CAPSULE | Freq: Three times a day (TID) | ORAL | 0 refills | Status: AC

## 2016-02-14 MED ORDER — OXYCODONE-ACETAMINOPHEN 5-325 MG PO TABS: 1.0000 | ORAL_TABLET | Freq: Once | ORAL | Status: DC

## 2016-02-14 MED ORDER — NAPROXEN 500 MG PO TABS
500.0000 mg | ORAL_TABLET | Freq: Two times a day (BID) | ORAL | 0 refills | Status: AC
Start: 2016-02-14 — End: ?

## 2016-02-14 NOTE — ED Triage Notes (Signed)
Patient is alert and oriented x4.  He is complaining of dental pain that has been on going for a week and has gotten worse over the last two days.

## 2016-02-14 NOTE — ED Provider Notes (Signed)
[facility]-EMERGENCY DEPT Provider Note   CSN: [id number] Arrival date & time: 02/14/16  1646  By signing my name below, I, Placido SouLogan Joldersma, attest that this documentation has been prepared under the direction and in the presence of Felicie Mornavid Mela Perham, NP. Electronically Signed: Placido SouLogan Joldersma, ED Scribe. 02/14/16. 5:31 PM.  History   Chief Complaint Chief Complaint  Patient presents with  . Oral Swelling    HPI HPI Comments: Frank Winters is a 30 y.o. male who presents to the Emergency Department complaining of constant, moderate, right sided dental pain x 1 week. Pt reports a h/o dental issues in the affected region. He has applied Orajel w/o significant relief. His pain worsens with palpation and when chewing. He reports associated mild facial swelling in the region. Pt scheduled an appointment with a dental provider but was "out of town at the time" and was not able to reschedule his appointment for another two months. No known allergies to abx. No other associated symptoms at this time.   The history is provided by the patient and the spouse. No language interpreter was used.    Past Medical History:  Diagnosis Date  . Staph infection     There are no active problems to display for this patient.   History reviewed. No pertinent surgical history.     Home Medications    Prior to Admission medications   Medication Sig Start Date End Date Taking? Authorizing Provider  acetaminophen (TYLENOL) 500 MG tablet Take 1,000 mg by mouth every 6 (six) hours as needed. For pain.    Historical Provider, MD  cephALEXin (KEFLEX) 500 MG capsule Take 1 capsule (500 mg total) by mouth 4 (four) times daily. Patient not taking: Reported on 01/05/2015 08/16/14   Harle BattiestElizabeth Tysinger, NP  clindamycin (CLEOCIN) 150 MG capsule Take 3 capsules (450 mg total) by mouth 3 (three) times daily. 01/05/15   Samantha Tripp Dowless, PA-C  doxycycline (VIBRAMYCIN) 100 MG capsule Take 1 capsule (100 mg total) by mouth 2  (two) times daily. One po bid x 7 days 10/06/15   Mercedes Camprubi-Soms, PA-C  HYDROcodone-acetaminophen (NORCO/VICODIN) 5-325 MG per tablet Take 1 tablet by mouth every 4 (four) hours as needed. Patient not taking: Reported on 01/05/2015 08/16/14   Harle BattiestElizabeth Tysinger, NP  HYDROcodone-acetaminophen (NORCO/VICODIN) 5-325 MG tablet Take 2 tablets by mouth every 4 (four) hours as needed. 06/23/15   Samantha Tripp Dowless, PA-C  naproxen (NAPROSYN) 500 MG tablet Take 1 tablet (500 mg total) by mouth 2 (two) times daily as needed for mild pain, moderate pain or headache (TAKE WITH MEALS.). 10/06/15   Mercedes Camprubi-Soms, PA-C  oxyCODONE-acetaminophen (PERCOCET/ROXICET) 5-325 MG tablet Take 2 tablets by mouth every 4 (four) hours as needed for severe pain. 01/05/15   Samantha Tripp Dowless, PA-C    Family History No family history on file.  Social History Social History  Substance Use Topics  . Smoking status: Current Every Day Smoker    Packs/day: 0.50    Types: Cigarettes  . Smokeless tobacco: Never Used  . Alcohol use No     Allergies   Patient has no known allergies.   Review of Systems Review of Systems  Constitutional: Negative for chills and fever.  HENT: Positive for dental problem and facial swelling.   All other systems reviewed and are negative.  Physical Exam Updated Vital Signs Ht 6\' 11"  (2.108 m)   Wt 260 lb (117.9 kg)   BMI 26.54 kg/m   Physical Exam  Constitutional: He is  oriented to person, place, and time. He appears well-developed and well-nourished.  HENT:  Head: Normocephalic and atraumatic.  Right Ear: Hearing, tympanic membrane, external ear and ear canal normal.  Left Ear: Hearing, tympanic membrane, external ear and ear canal normal.  Tooth #32 broken down to the gumline. Tooth #1 with significant cavity.   Eyes: EOM are normal.  Neck: Normal range of motion.  Cardiovascular: Normal rate, regular rhythm and normal heart sounds.   No murmur  heard. Pulmonary/Chest: Effort normal and breath sounds normal. No respiratory distress. He has no wheezes. He has no rales.  Abdominal: Soft.  Musculoskeletal: Normal range of motion.  Neurological: He is alert and oriented to person, place, and time.  Skin: Skin is warm and dry.  Psychiatric: He has a normal mood and affect.  Nursing note and vitals reviewed.  ED Treatments / Results  Labs (all labs ordered are listed, but only abnormal results are displayed) Labs Reviewed - No data to display  EKG  EKG Interpretation None       Radiology No results found.  Procedures Procedures  COORDINATION OF CARE: 5:31 PM Discussed next steps with pt. Pt verbalized understanding and is agreeable with the plan.    Medications Ordered in ED Medications - No data to display   Initial Impression / Assessment and Plan / ED Course  I have reviewed the triage vital signs and the nursing notes.  Pertinent labs & imaging results that were available during my care of the patient were reviewed by me and considered in my medical decision making (see chart for details).  Clinical Course     Patient with dentalgia.  No abscess requiring immediate incision and drainage.  Exam not concerning for Ludwig's angina or pharyngeal abscess. Will treat with clindamycin and naproxen. Pt instructed to follow-up with dentist.  Discussed return precautions. Pt safe for discharge.    Final Clinical Impressions(s) / ED Diagnoses   Final diagnoses:  Pain, dental    New Prescriptions Discharge Medication List as of 02/14/2016  6:01 PM      I personally performed the services described in this documentation, which was scribed in my presence. The recorded information has been reviewed and is accurate.    Felicie Mornavid Layn Kye, NP 02/14/16 2034    Loren Raceravid Yelverton, MD 02/14/16 [phone]

## 2016-02-14 NOTE — ED Notes (Signed)
Patient is alert and oriented x3.  He was given DC instructions and follow up visit instructions.  Patient gave verbal understanding.  He was DC ambulatory under his own power to home.  V/S stable.  He was not showing any signs of distress on DC 

## 2016-07-15 IMAGING — CR DG FOREARM 2V*L*
2 series · 2 of 2 positions shown · non-contrast
Comparison: None.

CLINICAL DATA: Puncture wound a posterior side of left forearm near
the midshaft from kitchen knife 3-4 days ago. Redness at the site.

EXAM:
LEFT FOREARM - 2 VIEW

[forearm ap]
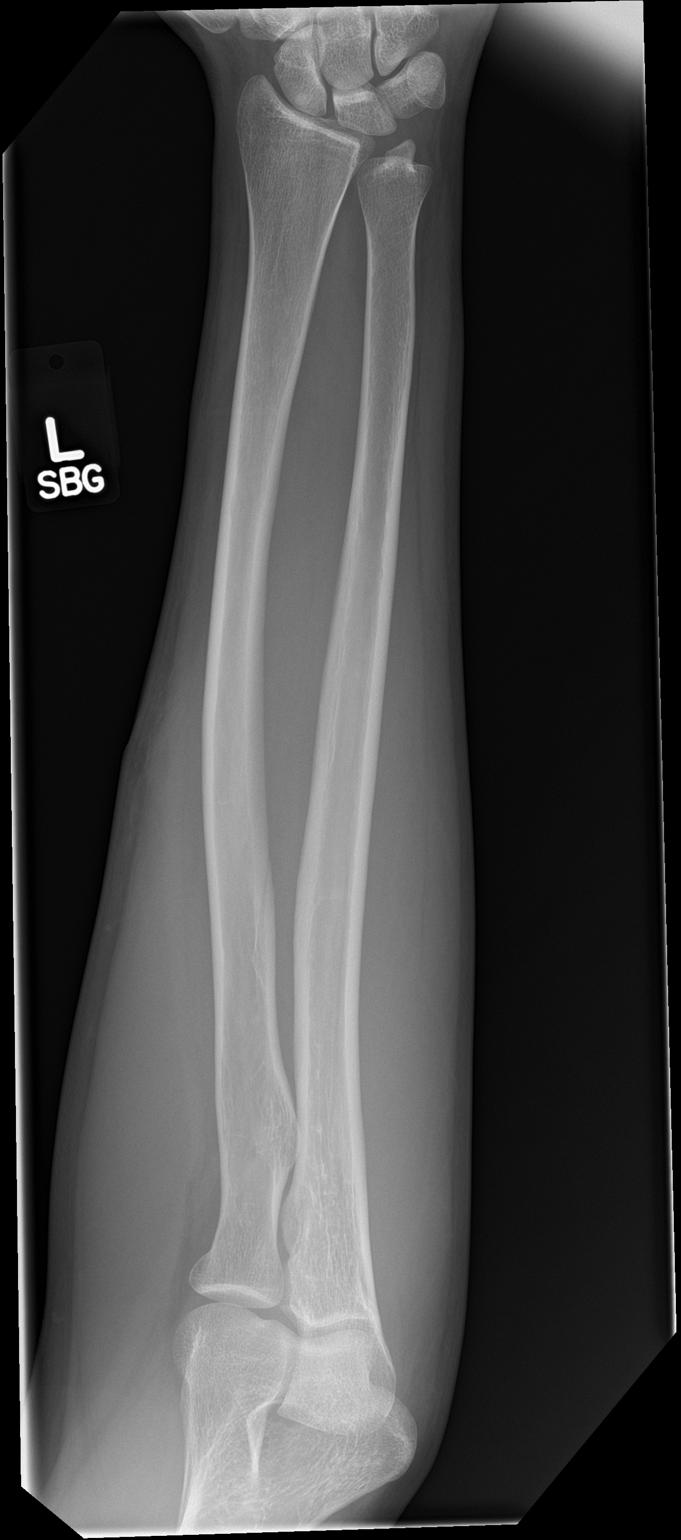

[forearm lat]
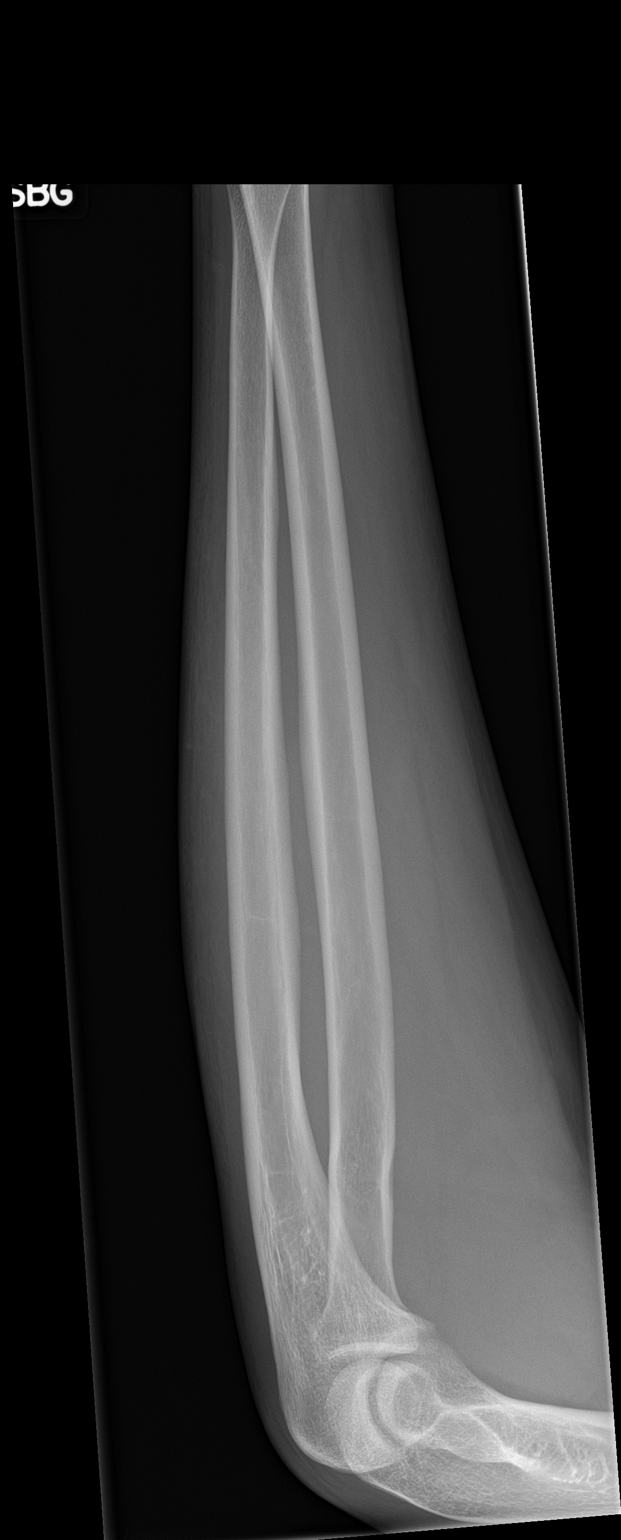

[2 of 2 positions shown; findings below may reference images not displayed]

FINDINGS: There is no evidence of fracture or other focal bone lesions. Soft
tissues are unremarkable. No radiopaque foreign body.
IMPRESSION: Negative.

## 2016-10-16 IMAGING — US US SCROTUM
1 series · 13 of 25 positions shown · non-contrast
Comparison: None.

CLINICAL DATA: Right groin and scrotal pain with abscess for 3
days. History of multiple abscess. Pain lateral to the right
scrotum, patient reports drainage yesterday.

EXAM:
ULTRASOUND OF SCROTUM
TECHNIQUE: Complete ultrasound examination of the testicles, epididymis, and
other scrotal structures was performed.

[Series 1: us scrotum · 0.06mm/px · 13 of 72 slices shown]
[im 1/72]
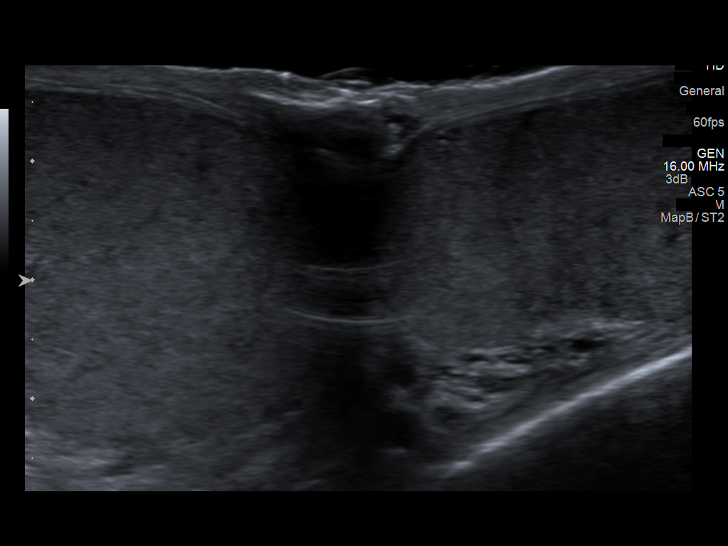
[im 6/72]
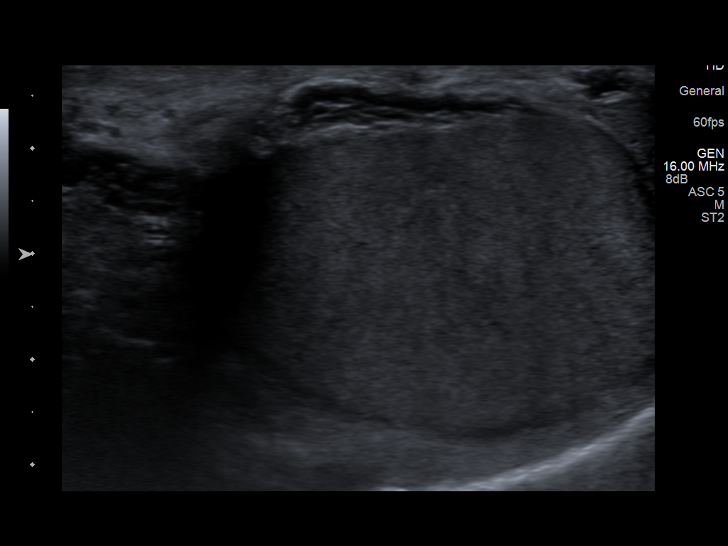
[im 12/72]
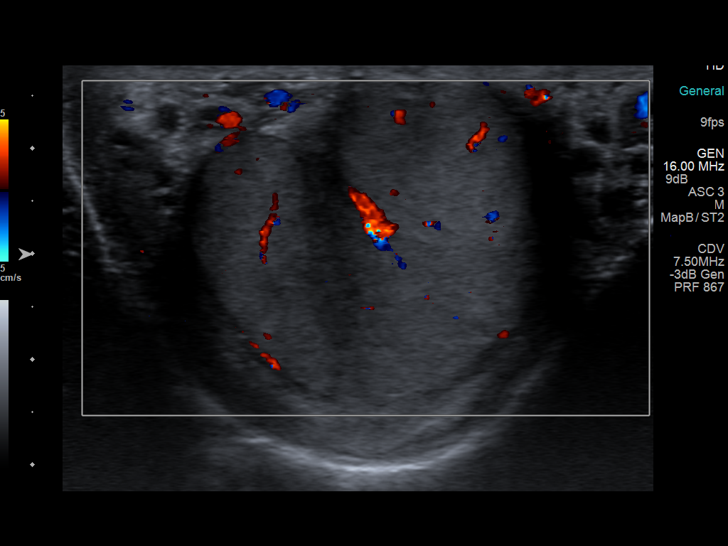
[im 18/72]
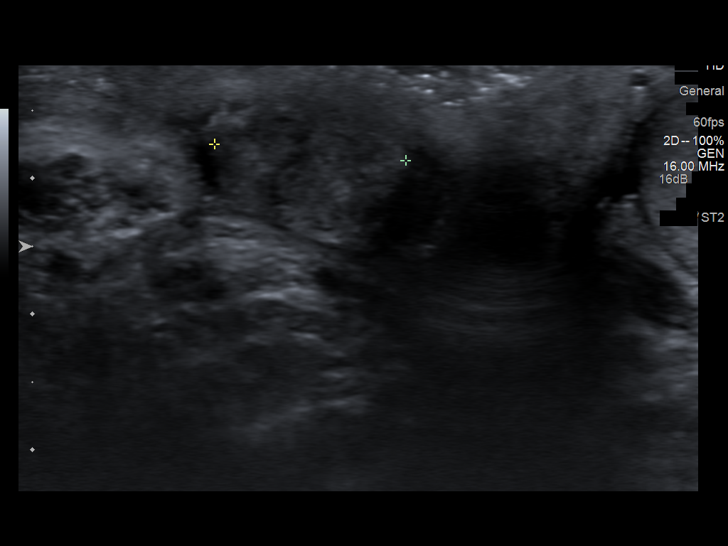
[im 24/72]
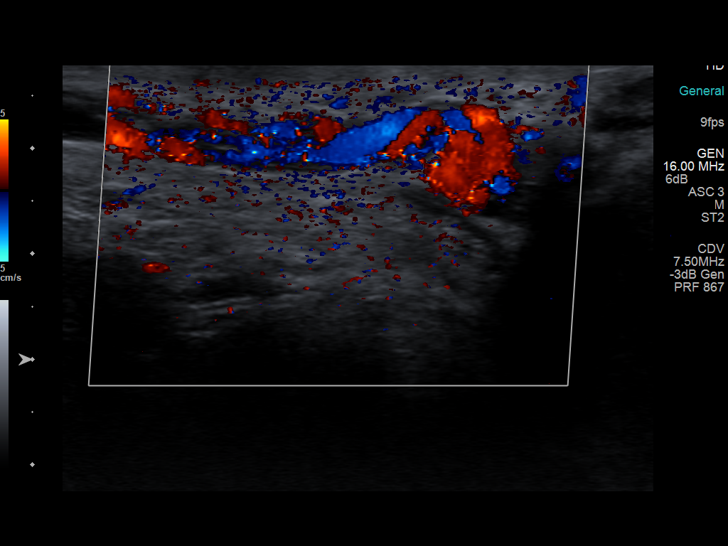
[im 30/72]
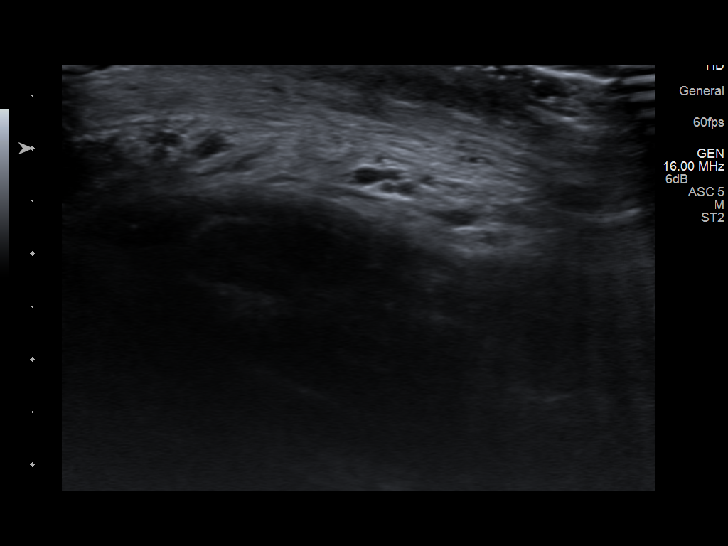
[im 36/72]
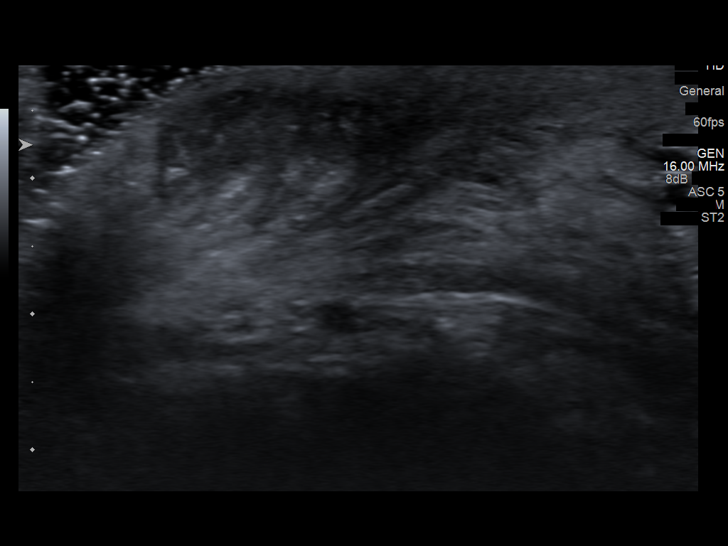
[im 42/72]
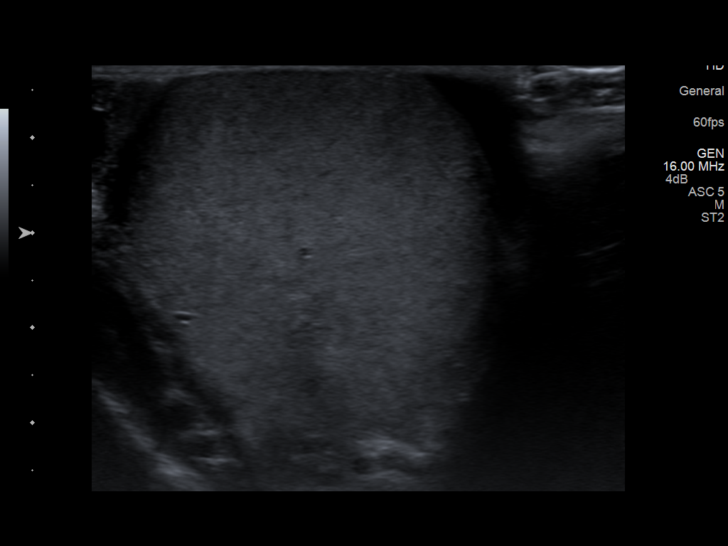
[im 48/72]
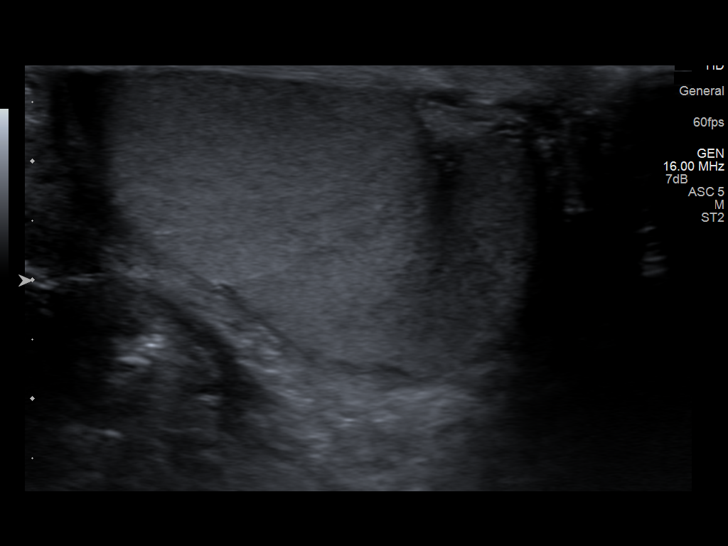
[im 54/72]
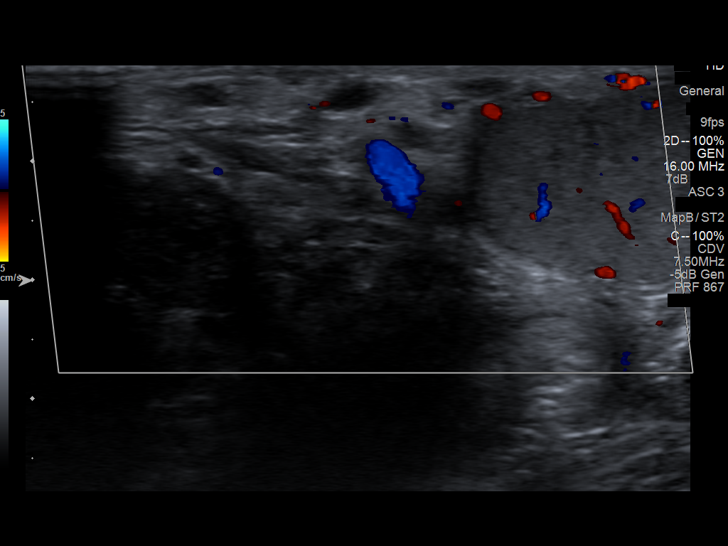
[im 60/72]
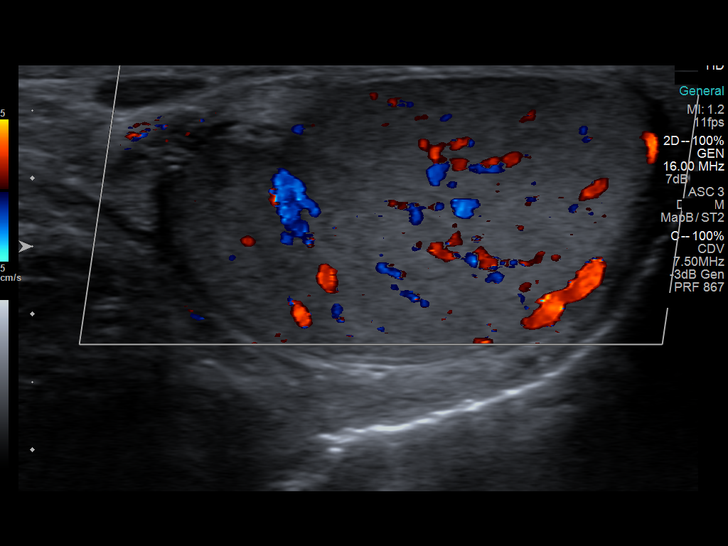
[im 66/72]
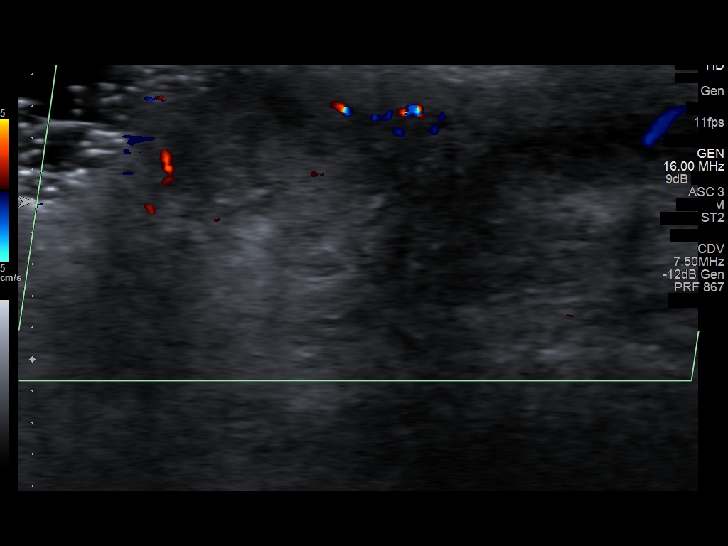
[im 72/72]
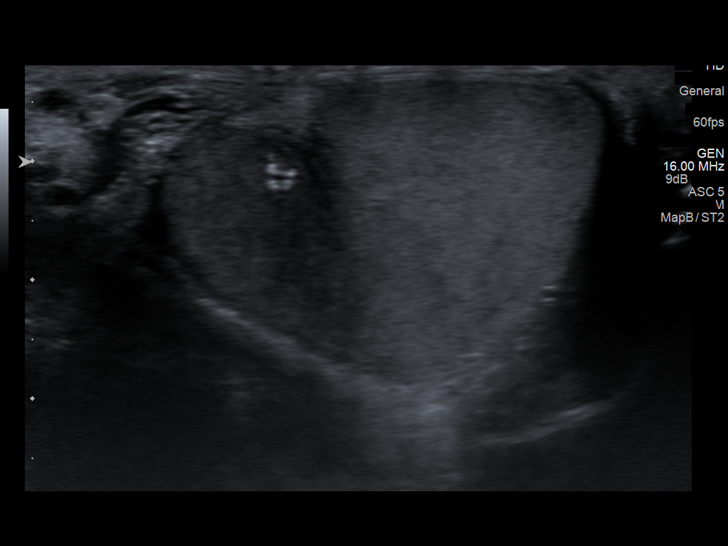

[13 of 25 positions shown; findings below may reference images not displayed]

FINDINGS: Right testicle

Measurements: 5.3 x 2.4 x 3.0 cm. There is a single 4 mm shadowing
calcification. No intratesticular mass or fluid collection. Normal
blood flow seen.

Left testicle

Measurements: 4.9 x 3.8 x 3.7 cm. No mass or microlithiasis
visualized. No focal fluid collection. Normal blood flow seen.

Right epididymis: Normal in size and appearance. Normal blood flow.

Left epididymis: Small 5 mm epididymal head cyst. Otherwise normal
in size and appearance. Normal blood flow seen.

Hydrocele:  None visualized.

Varicocele:  Small bilateral.

Other:

Sonographic evaluation in the area of pain and drainage demonstrates
subcutaneous soft tissue edema. There is no soft tissue abscess.
IMPRESSION: 1. No evidence of scrotal abscess. There is soft tissue edema in the
region of pain adjacent to the right scrotum.
2. Single 4 mm calcification in the right testicle.
3. Bilateral varicoceles.

## 2017-10-05 ENCOUNTER — Encounter (HOSPITAL_COMMUNITY): Payer: Self-pay | Admitting: Emergency Medicine

## 2017-10-05 ENCOUNTER — Emergency Department (HOSPITAL_COMMUNITY)
Admission: EM | Admit: 2017-10-05 | Discharge: 2017-10-08 | Disposition: E | Payer: Self-pay | Attending: Emergency Medicine | Admitting: Emergency Medicine

## 2017-10-05 DIAGNOSIS — Y999 Unspecified external cause status: Secondary | ICD-10-CM | POA: Insufficient documentation

## 2017-10-05 DIAGNOSIS — Y939 Activity, unspecified: Secondary | ICD-10-CM | POA: Insufficient documentation

## 2017-10-05 DIAGNOSIS — S0193XA Puncture wound without foreign body of unspecified part of head, initial encounter: Secondary | ICD-10-CM | POA: Insufficient documentation

## 2017-10-05 DIAGNOSIS — W3400XA Accidental discharge from unspecified firearms or gun, initial encounter: Secondary | ICD-10-CM | POA: Insufficient documentation

## 2017-10-05 DIAGNOSIS — Y929 Unspecified place or not applicable: Secondary | ICD-10-CM | POA: Insufficient documentation

## 2017-10-05 LAB — PREPARE FRESH FROZEN PLASMA: Unit division: 0

## 2017-10-05 LAB — BPAM FFP
BLOOD PRODUCT EXPIRATION DATE: 201907022359
Blood Product Expiration Date: 201907022359
ISSUE DATE / TIME: 201906282150
ISSUE DATE / TIME: 201906282150
UNIT TYPE AND RH: 6200
Unit Type and Rh: 6200

## 2017-10-05 LAB — BPAM RBC
BLOOD PRODUCT EXPIRATION DATE: 201907152359
Blood Product Expiration Date: 201907052359
ISSUE DATE / TIME: 201906282150
ISSUE DATE / TIME: 201906282150
UNIT TYPE AND RH: 9500
Unit Type and Rh: 9500

## 2017-10-08 ENCOUNTER — Encounter (HOSPITAL_COMMUNITY): Payer: Self-pay | Admitting: *Deleted
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
## 2017-10-08 NOTE — ED Notes (Signed)
M.E. Paged.
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
## 2017-10-08 DEATH — deceased
